# Patient Record
Sex: Male | Born: 1980 | Race: White | Hispanic: No | Marital: Married | Smoking: Never smoker
Health system: Southern US, Community
[De-identification: ages and names within clinical notes are randomized; demographics above are authoritative.]

## PROBLEM LIST (undated history)

## (undated) DIAGNOSIS — R0981 Nasal congestion: Secondary | ICD-10-CM

## (undated) DIAGNOSIS — Z98811 Dental restoration status: Secondary | ICD-10-CM

## (undated) DIAGNOSIS — I10 Essential (primary) hypertension: Secondary | ICD-10-CM

## (undated) DIAGNOSIS — K589 Irritable bowel syndrome without diarrhea: Secondary | ICD-10-CM

## (undated) DIAGNOSIS — G4730 Sleep apnea, unspecified: Secondary | ICD-10-CM

## (undated) DIAGNOSIS — M2550 Pain in unspecified joint: Secondary | ICD-10-CM

## (undated) DIAGNOSIS — R011 Cardiac murmur, unspecified: Secondary | ICD-10-CM

## (undated) DIAGNOSIS — M1990 Unspecified osteoarthritis, unspecified site: Secondary | ICD-10-CM

## (undated) DIAGNOSIS — K610 Anal abscess: Secondary | ICD-10-CM

## (undated) DIAGNOSIS — E559 Vitamin D deficiency, unspecified: Secondary | ICD-10-CM

## (undated) DIAGNOSIS — E7800 Pure hypercholesterolemia, unspecified: Secondary | ICD-10-CM

## (undated) DIAGNOSIS — Z9109 Other allergy status, other than to drugs and biological substances: Secondary | ICD-10-CM

## (undated) HISTORY — PX: CHOLECYSTECTOMY: SHX55

## (undated) HISTORY — DX: Other allergy status, other than to drugs and biological substances: Z91.09

## (undated) HISTORY — PX: KNEE ARTHROSCOPY: SHX127

## (undated) HISTORY — DX: Unspecified osteoarthritis, unspecified site: M19.90

## (undated) HISTORY — DX: Pure hypercholesterolemia, unspecified: E78.00

## (undated) HISTORY — DX: Pain in unspecified joint: M25.50

## (undated) HISTORY — PX: SHOULDER ARTHROSCOPY: SHX128

## (undated) HISTORY — DX: Vitamin D deficiency, unspecified: E55.9

## (undated) HISTORY — PX: ASD REPAIR: SHX258

---
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
## 2007-07-21 ENCOUNTER — Encounter: Admission: RE | Admit: 2007-07-21 | Discharge: 2007-07-21 | Payer: Self-pay | Admitting: Sports Medicine

## 2009-11-21 ENCOUNTER — Ambulatory Visit (HOSPITAL_COMMUNITY): Admission: RE | Admit: 2009-11-21 | Discharge: 2009-11-21 | Payer: Self-pay | Admitting: Family Medicine

## 2009-12-10 ENCOUNTER — Encounter: Admission: RE | Admit: 2009-12-10 | Discharge: 2009-12-10 | Payer: Self-pay | Admitting: Gastroenterology

## 2010-07-21 ENCOUNTER — Ambulatory Visit: Payer: Self-pay

## 2010-07-21 ENCOUNTER — Other Ambulatory Visit: Payer: Self-pay | Admitting: Occupational Medicine

## 2010-07-21 DIAGNOSIS — R52 Pain, unspecified: Secondary | ICD-10-CM

## 2016-01-10 ENCOUNTER — Emergency Department (HOSPITAL_COMMUNITY): Payer: 59

## 2016-01-10 ENCOUNTER — Encounter (HOSPITAL_COMMUNITY): Payer: Self-pay | Admitting: Emergency Medicine

## 2016-01-10 ENCOUNTER — Emergency Department (HOSPITAL_COMMUNITY)
Admission: EM | Admit: 2016-01-10 | Discharge: 2016-01-10 | Disposition: A | Discharge status: Home / Self Care | Payer: 59 | Attending: Emergency Medicine | Admitting: Emergency Medicine

## 2016-01-10 DIAGNOSIS — R1010 Upper abdominal pain, unspecified: Secondary | ICD-10-CM | POA: Diagnosis present

## 2016-01-10 DIAGNOSIS — Z79899 Other long term (current) drug therapy: Secondary | ICD-10-CM | POA: Insufficient documentation

## 2016-01-10 DIAGNOSIS — R7989 Other specified abnormal findings of blood chemistry: Secondary | ICD-10-CM

## 2016-01-10 DIAGNOSIS — R945 Abnormal results of liver function studies: Secondary | ICD-10-CM | POA: Diagnosis not present

## 2016-01-10 DIAGNOSIS — R109 Unspecified abdominal pain: Secondary | ICD-10-CM

## 2016-01-10 DIAGNOSIS — R1013 Epigastric pain: Secondary | ICD-10-CM | POA: Diagnosis not present

## 2016-01-10 LAB — URINALYSIS, ROUTINE W REFLEX MICROSCOPIC
Bacteria, UA: NONE SEEN
Bilirubin Urine: NEGATIVE
GLUCOSE, UA: NEGATIVE mg/dL
Ketones, ur: NEGATIVE mg/dL
Nitrite: NEGATIVE
PH: 5 (ref 5.0–8.0)
PROTEIN: NEGATIVE mg/dL
SPECIFIC GRAVITY, URINE: 1.011 (ref 1.005–1.030)

## 2016-01-10 LAB — CBC
HCT: 43.5 % (ref 39.0–52.0)
Hemoglobin: 15.2 g/dL (ref 13.0–17.0)
MCH: 29 pg (ref 26.0–34.0)
MCHC: 34.9 g/dL (ref 30.0–36.0)
MCV: 83 fL (ref 78.0–100.0)
PLATELETS: 216 10*3/uL (ref 150–400)
RBC: 5.24 MIL/uL (ref 4.22–5.81)
RDW: 12.3 % (ref 11.5–15.5)
WBC: 3.8 10*3/uL — ABNORMAL LOW (ref 4.0–10.5)

## 2016-01-10 LAB — COMPREHENSIVE METABOLIC PANEL
ALK PHOS: 73 U/L (ref 38–126)
ALT: 96 U/L — ABNORMAL HIGH (ref 17–63)
AST: 165 U/L — ABNORMAL HIGH (ref 15–41)
Albumin: 4.6 g/dL (ref 3.5–5.0)
Anion gap: 10 (ref 5–15)
BILIRUBIN TOTAL: 1.1 mg/dL (ref 0.3–1.2)
BUN: 6 mg/dL (ref 6–20)
CALCIUM: 8.9 mg/dL (ref 8.9–10.3)
CO2: 27 mmol/L (ref 22–32)
CREATININE: 0.98 mg/dL (ref 0.61–1.24)
Chloride: 102 mmol/L (ref 101–111)
GFR calc Af Amer: >60 mL/min (ref >60)
GFR calc non Af Amer: >60 mL/min (ref >60)
Glucose, Bld: 98 mg/dL (ref 65–99)
Potassium: 3.6 mmol/L (ref 3.5–5.1)
SODIUM: 139 mmol/L (ref 135–145)
Total Protein: 7.9 g/dL (ref 6.5–8.1)

## 2016-01-10 LAB — LIPASE, BLOOD: Lipase: 30 U/L (ref 11–51)

## 2016-01-10 MED ORDER — MORPHINE SULFATE (PF) 4 MG/ML IV SOLN
4.0000 mg | Freq: Once | INTRAVENOUS | Status: AC
  Administered 2016-01-10: 4 mg via INTRAVENOUS
  Filled 2016-01-10: qty 1

## 2016-01-10 MED ORDER — SODIUM CHLORIDE 0.9 % IV BOLUS (SEPSIS)
1000.0000 mL | Freq: Once | INTRAVENOUS | Status: AC
  Administered 2016-01-10: 1000 mL via INTRAVENOUS

## 2016-01-10 MED ORDER — IOPAMIDOL (ISOVUE-300) INJECTION 61%
100.0000 mL | Freq: Once | INTRAVENOUS | Status: AC | PRN
  Administered 2016-01-10: 100 mL via INTRAVENOUS

## 2016-01-10 MED ORDER — ONDANSETRON HCL 4 MG/2ML IJ SOLN
4.0000 mg | Freq: Once | INTRAMUSCULAR | Status: AC
  Administered 2016-01-10: 4 mg via INTRAVENOUS
  Filled 2016-01-10: qty 2

## 2016-01-10 NOTE — Progress Notes (Signed)
Pt confirms pcp as Ship brokersarah carter spencer PA EPIC updated

## 2016-01-10 NOTE — ED Provider Notes (Signed)
[facility]-EMERGENCY DEPT Provider Note   CSN: [id number] Arrival date & time: 01/10/16  4098     History   Chief Complaint Chief Complaint  Patient presents with  . Back Pain  . Abdominal Pain    HPI Frank Clarke is a 35 y.o. male.  HPI Patient presents to the emergency room for evaluation of abdominal pain.  Patient started having trouble with vomiting and diarrhea on Tuesday. He had multiple episodes. That morning he went to take a shower and had a syncopal episode. Patient did not injure himself. He tried taking some Phenergan and resting. He started to feel somewhat better and was able to go to work on Thursday. However he started having pain again later in the day throughout the night.  The pain is in the upper abdomen and radiates to his back. He does have some chronic thoracic back pain issues and is not sure if this is exacerbating or this is new related to his abdominal pain. He denies any recurrent nausea or vomiting. Because of the pain he went to an urgent care. He was referred to the emergency room for further evaluation. History reviewed. No pertinent past medical history.  There are no active problems to display for this patient.   History reviewed. No pertinent surgical history.     Home Medications    Prior to Admission medications   Medication Sig Start Date End Date Taking? Authorizing Provider  cetirizine (ZYRTEC) 10 MG tablet Take 10 mg by mouth every evening. 01/01/16  Yes Historical Provider, MD  diclofenac (VOLTAREN) 75 MG EC tablet Take 75 mg by mouth 2 (two) times daily. 11/30/15  Yes Historical Provider, MD  hyoscyamine (LEVSIN SL) 0.125 MG SL tablet Place 0.125 mg under the tongue daily as needed for cramping. 09/21/12  Yes Historical Provider, MD    Family History History reviewed. No pertinent family history.  Social History Social History  Substance Use Topics  . Smoking status: Never Smoker  . Smokeless tobacco: Never Used  . Alcohol use  Yes     Comment: 2 drinks a week     Allergies   Patient has no known allergies.   Review of Systems Review of Systems   Physical Exam Updated Vital Signs BP 111/76 (BP Location: Left Arm)   Pulse 70   Temp 98.1 F (36.7 C) (Oral)   Resp 14   SpO2 97%   Physical Exam  Constitutional: He appears well-developed and well-nourished. No distress.  HENT:  Head: Normocephalic and atraumatic.  Right Ear: External ear normal.  Left Ear: External ear normal.  Eyes: Conjunctivae are normal. Right eye exhibits no discharge. Left eye exhibits no discharge. No scleral icterus.  Neck: Neck supple. No tracheal deviation present.  Cardiovascular: Normal rate, regular rhythm and intact distal pulses.   Pulmonary/Chest: Effort normal and breath sounds normal. No stridor. No respiratory distress. He has no wheezes. He has no rales.  Abdominal: Soft. Bowel sounds are normal. He exhibits no distension. There is tenderness. There is no rebound and no guarding.  Mild epigastric region  Musculoskeletal: He exhibits no edema or tenderness.  Neurological: He is alert. He has normal strength. No cranial nerve deficit (no facial droop, extraocular movements intact, no slurred speech) or sensory deficit. He exhibits normal muscle tone. He displays no seizure activity. Coordination normal.  Skin: Skin is warm and dry. No rash noted.  Psychiatric: He has a normal mood and affect.  Nursing note and vitals reviewed.  ED Treatments / Results  Labs (all labs ordered are listed, but only abnormal results are displayed) Labs Reviewed  COMPREHENSIVE METABOLIC PANEL - Abnormal; Notable for the following:       Result Value   AST 165 (*)    ALT 96 (*)    All other components within normal limits  CBC - Abnormal; Notable for the following:    WBC 3.8 (*)    All other components within normal limits  URINALYSIS, ROUTINE W REFLEX MICROSCOPIC - Abnormal; Notable for the following:    Hgb urine dipstick  SMALL (*)    Leukocytes, UA TRACE (*)    Squamous Epithelial / LPF 0-5 (*)    All other components within normal limits  LIPASE, BLOOD    EKG  EKG Interpretation  Date/Time:  Friday January 10 2016 09:54:12 EST Ventricular Rate:  88 PR Interval:    QRS Duration: 99 QT Interval:  373 QTC Calculation: 452 R Axis:   78 Text Interpretation:  Sinus rhythm Low voltage, precordial leads RSR' in V1 or V2, right VCD or RVH Baseline wander in lead(s) V1 No old tracing to compare Confirmed by Natha Guin  MD-J, Leonia Heatherly ([id number]) on 01/10/2016 9:59:44 AM Also confirmed by Demesha Boorman  MD-J, Mozes Sagar [phone]), editor [place]  CCT, BEVERLY ([id number])  on 01/10/2016 10:45:38 AM       Radiology Dg Chest 2 View  Result Date: 01/10/2016 CLINICAL DATA:  Mid upper abdominal pain, nausea and vomiting. EXAM: CHEST  2 VIEW COMPARISON:  None. FINDINGS: Trachea is midline. Heart size normal. Lungs are clear. No pleural fluid. Visualized portion of the upper abdomen is unremarkable. IMPRESSION: No acute findings. Electronically Signed   By: Leanna BattlesMelinda  Blietz M.D.   On: 01/10/2016 11:04   Ct Abdomen Pelvis W Contrast  Result Date: 01/10/2016 CLINICAL DATA:  Abdominal pain EXAM: CT ABDOMEN AND PELVIS WITH CONTRAST TECHNIQUE: Multidetector CT imaging of the abdomen and pelvis was performed using the standard protocol following bolus administration of intravenous contrast. CONTRAST:  100mL ISOVUE-300 IOPAMIDOL (ISOVUE-300) INJECTION 61% COMPARISON:  12/10/2009 FINDINGS: Lower chest: No acute abnormality. Hepatobiliary: Postcholecystectomy.  Liver is unremarkable. Pancreas: Unremarkable. Spleen: Unremarkable. Adrenals/Urinary Tract: Adrenal glands and kidneys are unremarkable. Unremarkable bladder. Stomach/Bowel: No evidence of small-bowel obstruction. No obvious focal mass of the colon. Normal appendix. Vascular/Lymphatic: No aortic aneurysm. There are several subcentimeter short axis diameter para-aortic lymph nodes within the abdomen.  There is also significant stranding within the fat surrounding the small lymph nodes. These findings are not significantly changed compare with the prior MRI, and support benign etiology. Reproductive: Prostate is unremarkable. Other: No free-fluid. 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 Posterior disc bulge at L3-4. No vertebral compression deformity. IMPRESSION: No acute intra-abdominal pathology.  Chronic changes are noted. Electronically Signed   By: Jolaine ClickArthur  Hoss M.D.   On: 01/10/2016 12:06    Procedures Procedures (including critical care time)  Medications Ordered in ED Medications  morphine 4 MG/ML injection 4 mg (4 mg Intravenous Given 01/10/16 1034)  ondansetron (ZOFRAN) injection 4 mg (4 mg Intravenous Given 01/10/16 1033)  sodium chloride 0.9 % bolus 1,000 mL (1,000 mLs Intravenous New Bag/Given 01/10/16 1033)  iopamidol (ISOVUE-300) 61 % injection 100 mL (100 mLs Intravenous Contrast Given 01/10/16 1140)     Initial Impression / Assessment and Plan / ED Course  I have reviewed the triage vital signs and the nursing notes.  Pertinent labs & imaging results that were available during my care of the patient were  reviewed by me and considered in my medical decision making (see chart for details).  Clinical Course as of Jan 10 1304  Fri Jan 10, 2016  1142 LFTs increased with normal bili.  Trace LE and 0-5 WBC in urine .  Otherwise normal labs.   [JK]    Clinical Course User Index [JK] Linwood DibblesJon Jayliah Benett, MD    Lab tests and CT scan are reassuring.  LFTs are increased.  Etiology unclear.  No structural issue noted on CT scan.  ?viral etiology with the previous vomiting and diarrhea.  Fortunately, no acute abnormality today requiring surgery or hospitalization.  Discussed plans to follow up on the LFTs  Final Clinical Impressions(s) / ED Diagnoses   Final diagnoses:  Abdominal pain, unspecified abdominal location  Elevated LFTs    New Prescriptions New  Prescriptions   No medications on file     Linwood DibblesJon Nohealani Medinger, MD 01/10/16 1305

## 2016-01-10 NOTE — Discharge Instructions (Signed)
Follow-up with your primary care doctor as we discussed to recheck your liver function tests.  Return as needed for fever, vomiting, worsening symptoms

## 2016-01-10 NOTE — ED Triage Notes (Addendum)
Pt reports upper abd pain and back pain since yesterday. Denies CP. Pt reports it felt like his reflux yesterday, but no reflux today and pain continues.  Pt finishing recovering from stomach but that had an onset on Tuesday. Able to keep fluids and crackers down since yesterday. Did have a syncopal episode and fall a few days ago in midst of stomach bug and accompanied by fever.  No lightheadedness, dizziness, or SOB.

## 2016-10-16 ENCOUNTER — Other Ambulatory Visit: Payer: Self-pay | Admitting: Surgery

## 2016-10-19 DIAGNOSIS — K610 Anal abscess: Secondary | ICD-10-CM

## 2016-10-19 HISTORY — DX: Anal abscess: K61.0

## 2016-10-29 ENCOUNTER — Encounter (HOSPITAL_BASED_OUTPATIENT_CLINIC_OR_DEPARTMENT_OTHER): Payer: Self-pay | Admitting: *Deleted

## 2016-10-29 DIAGNOSIS — R0981 Nasal congestion: Secondary | ICD-10-CM

## 2016-10-29 HISTORY — DX: Nasal congestion: R09.81

## 2016-11-03 NOTE — H&P (Signed)
Frank Clarke  Location: [facility] Patient #: [id number] DOB: 10/05/1980 Married / Language: English / Race: White Male   History of Present Illness  Patient words: New- hidradenitis.  The patient is a 36 year old male who presents with a complaint of Hidradenitis. Frank Clarke is well-known to me. He has had hidradenitis since he was in his teens. He is recently had an area on his perianal area that has continued to drain.   Past Surgical History  Gallbladder Surgery - Laparoscopic  Knee Surgery  Bilateral. Shoulder Surgery  Right.  Diagnostic Studie  Colonoscopy  1-5 years ago  Allergies  No Known Drug Allergies 09/15/2016  Medication History  Diclofenac Sodium (  Tablet DR, Oral) Active. ZyrTEC (  Tablet, Oral) Active. Medications Reconciled  Social History  Alcohol use  Occasional alcohol use. Caffeine use  Carbonated beverages, Coffee, Tea. No drug use  Tobacco use  Never smoker.  Family History  Arthritis  Father. Colon Polyps  Mother. Melanoma  Mother. Migraine Headache  Brother, Father.  Other Problems  Back Pain  Heart murmur     Review of Systems  General Not Present- Appetite Loss, Chills, Fatigue, Fever, Night Sweats, Weight Gain and Weight Loss. Skin Present- Ulcer. Not Present- Change in Wart/Mole, Dryness, Hives, Jaundice, New Lesions, Non-Healing Wounds and Rash. HEENT Present- Seasonal Allergies. Not Present- Earache, Hearing Loss, Hoarseness, Nose Bleed, Oral Ulcers, Ringing in the Ears, Sinus Pain, Sore Throat, Visual Disturbances, Wears glasses/contact lenses and Yellow Eyes. Respiratory Not Present- Bloody sputum, Chronic Cough, Difficulty Breathing, Snoring and Wheezing. Breast Not Present- Breast Mass, Breast Pain, Nipple Discharge and Skin Changes. Cardiovascular Not Present- Chest Pain, Difficulty Breathing Lying Down, Leg Cramps, Palpitations, Rapid Heart Rate, Shortness of Breath and Swelling of  Extremities. Gastrointestinal Not Present- Abdominal Pain, Bloating, Bloody Stool, Change in Bowel Habits, Chronic diarrhea, Constipation, Difficulty Swallowing, Excessive gas, Gets full quickly at meals, Hemorrhoids, Indigestion, Nausea, Rectal Pain and Vomiting. Male Genitourinary Not Present- Blood in Urine, Change in Urinary Stream, Frequency, Impotence, Nocturia, Painful Urination, Urgency and Urine Leakage. Musculoskeletal Present- Back Pain. Not Present- Joint Pain, Joint Stiffness, Muscle Pain, Muscle Weakness and Swelling of Extremities. Neurological Not Present- Decreased Memory, Fainting, Headaches, Numbness, Seizures, Tingling, Tremor, Trouble walking and Weakness. Psychiatric Not Present- Anxiety, Bipolar, Change in Sleep Pattern, Depression, Fearful and Frequent crying. Endocrine Not Present- Cold Intolerance, Excessive Hunger, Hair Changes, Heat Intolerance, Hot flashes and New Diabetes. Hematology Not Present- Blood Thinners, Easy Bruising, Excessive bleeding, Gland problems, HIV and Persistent Infections.  Vitals   Weight: 275 lb Height: 71.5in Body Surface Area: 2.43 m Body Mass Index: 37.82 kg/m  Temp.: 98.58F(Oral)  Pulse: 90 (Regular)  BP: 126/74 (Sitting, Left Arm, Standard)    Physical Exam  The physical exam findings are as follows: Note:He has chronic skin changes of both his axilla as well as across his lower abdomen consistent with hidradenitis. There is a small area at the external perianal skin near the buttocks which is consistent with an abscess draining some fluid.  After obtaining consent, I prepped with Betadine, anesthetized with lidocaine, and then ellipsed out a piece of skin with a punch biopsy. I then entered a deeper abscess cavity. I made the incision slightly larger and impact the cavity.    Assessment & Plan   HIDRADENITIS (L73.2)  Impression: This may represent just a de novo. No abscess and hidradenitis. I will put him on  clindamycin for this area and he will try the clindamycin gel on  his other areas of hidradenitis. I will see him back as needed Current Plans Started Norco 5-325MG , 1 (one) Tablet every four hours, as needed, #20, 09/15/2016, No Refill. Started Clindamycin HCl , 1 (one) Capsule three times daily, #21, 09/15/2016, No Refill. Started Clindamycin Phosphate 1%, 1 (one) Application two times daily, 1 Tube, 09/15/2016, Ref. x3.  Addendum:  The place in his perianal area has persisted.  This may mean there is a potential perianal fistula.  We will proceed to the OR for an Exam under anesthesia and possible fistulotomy vs wider abscess drainage.  We discussed the risks in detail.

## 2016-11-04 ENCOUNTER — Encounter (HOSPITAL_BASED_OUTPATIENT_CLINIC_OR_DEPARTMENT_OTHER): Payer: Self-pay | Admitting: *Deleted

## 2016-11-04 ENCOUNTER — Ambulatory Visit (HOSPITAL_BASED_OUTPATIENT_CLINIC_OR_DEPARTMENT_OTHER)
Admission: RE | Admit: 2016-11-04 | Discharge: 2016-11-04 | Disposition: A | Discharge status: Home / Self Care | Payer: 59 | Source: Ambulatory Visit | Attending: Surgery | Admitting: Surgery

## 2016-11-04 ENCOUNTER — Ambulatory Visit (HOSPITAL_BASED_OUTPATIENT_CLINIC_OR_DEPARTMENT_OTHER): Payer: 59 | Admitting: Certified Registered"

## 2016-11-04 ENCOUNTER — Encounter (HOSPITAL_BASED_OUTPATIENT_CLINIC_OR_DEPARTMENT_OTHER)
Admission: RE | Disposition: A | Discharge status: Home / Self Care | Payer: Self-pay | Source: Ambulatory Visit | Attending: Surgery

## 2016-11-04 DIAGNOSIS — K610 Anal abscess: Secondary | ICD-10-CM | POA: Diagnosis present

## 2016-11-04 HISTORY — PX: EVALUATION UNDER ANESTHESIA WITH FISTULECTOMY: SHX5623

## 2016-11-04 HISTORY — DX: Dental restoration status: Z98.811

## 2016-11-04 HISTORY — DX: Irritable bowel syndrome, unspecified: K58.9

## 2016-11-04 HISTORY — DX: Anal abscess: K61.0

## 2016-11-04 HISTORY — DX: Nasal congestion: R09.81

## 2016-11-04 SURGERY — EXAM UNDER ANESTHESIA WITH FISTULECTOMY
Anesthesia: General | Site: Buttocks | Laterality: Left

## 2016-11-04 MED ORDER — LACTATED RINGERS IV SOLN
INTRAVENOUS | Status: DC
  Administered 2016-11-04 (×2): via INTRAVENOUS

## 2016-11-04 MED ORDER — BUPIVACAINE LIPOSOME 1.3 % IJ SUSP
INTRAMUSCULAR | Status: AC
  Filled 2016-11-04: qty 20

## 2016-11-04 MED ORDER — BUPIVACAINE-EPINEPHRINE 0.5% -1:200000 IJ SOLN
INTRAMUSCULAR | Status: DC | PRN
Start: 2016-11-04 — End: 2016-11-04
  Administered 2016-11-04: 10 mL

## 2016-11-04 MED ORDER — FENTANYL CITRATE (PF) 100 MCG/2ML IJ SOLN
INTRAMUSCULAR | Status: AC
  Filled 2016-11-04: qty 2

## 2016-11-04 MED ORDER — FENTANYL CITRATE (PF) 100 MCG/2ML IJ SOLN
50.0000 ug | INTRAMUSCULAR | Status: DC | PRN
  Administered 2016-11-04: 100 ug via INTRAVENOUS

## 2016-11-04 MED ORDER — BUPIVACAINE-EPINEPHRINE (PF) 0.5% -1:200000 IJ SOLN
INTRAMUSCULAR | Status: AC
  Filled 2016-11-04: qty 90

## 2016-11-04 MED ORDER — DEXAMETHASONE SODIUM PHOSPHATE 4 MG/ML IJ SOLN
INTRAMUSCULAR | Status: DC | PRN
  Administered 2016-11-04: 10 mg via INTRAVENOUS

## 2016-11-04 MED ORDER — OXYCODONE HCL 5 MG PO TABS: 5.0000 mg | ORAL_TABLET | Freq: Four times a day (QID) | ORAL | 0 refills | Status: DC | PRN

## 2016-11-04 MED ORDER — PROPOFOL 10 MG/ML IV BOLUS
INTRAVENOUS | Status: DC | PRN
  Administered 2016-11-04: 200 mg via INTRAVENOUS

## 2016-11-04 MED ORDER — LIDOCAINE 5 % EX OINT: 1.0000 "application " | TOPICAL_OINTMENT | Freq: Four times a day (QID) | CUTANEOUS | 0 refills | Status: DC | PRN

## 2016-11-04 MED ORDER — KETOROLAC TROMETHAMINE 30 MG/ML IJ SOLN
INTRAMUSCULAR | Status: DC | PRN
  Administered 2016-11-04: 30 mg via INTRAVENOUS

## 2016-11-04 MED ORDER — METHYLENE BLUE 0.5 % INJ SOLN
INTRAVENOUS | Status: AC
  Filled 2016-11-04: qty 20

## 2016-11-04 MED ORDER — BUPIVACAINE LIPOSOME 1.3 % IJ SUSP
INTRAMUSCULAR | Status: DC | PRN
  Administered 2016-11-04: 20 mL

## 2016-11-04 MED ORDER — MIDAZOLAM HCL 2 MG/2ML IJ SOLN
1.0000 mg | INTRAMUSCULAR | Status: DC | PRN
  Administered 2016-11-04: 2 mg via INTRAVENOUS

## 2016-11-04 MED ORDER — SCOPOLAMINE 1 MG/3DAYS TD PT72: 1.0000 | MEDICATED_PATCH | Freq: Once | TRANSDERMAL | Status: DC | PRN

## 2016-11-04 MED ORDER — MIDAZOLAM HCL 2 MG/2ML IJ SOLN
INTRAMUSCULAR | Status: AC
  Filled 2016-11-04: qty 2

## 2016-11-04 MED ORDER — LIDOCAINE HCL (CARDIAC) 20 MG/ML IV SOLN
INTRAVENOUS | Status: DC | PRN
  Administered 2016-11-04: 60 mg via INTRAVENOUS

## 2016-11-04 MED ORDER — CHLORHEXIDINE GLUCONATE CLOTH 2 % EX PADS: 6.0000 | MEDICATED_PAD | Freq: Once | CUTANEOUS | Status: DC

## 2016-11-04 MED ORDER — CEFAZOLIN SODIUM-DEXTROSE 1-4 GM/50ML-% IV SOLN
INTRAVENOUS | Status: AC
  Filled 2016-11-04: qty 50

## 2016-11-04 MED ORDER — CEFAZOLIN SODIUM-DEXTROSE 2-4 GM/100ML-% IV SOLN
INTRAVENOUS | Status: AC
  Filled 2016-11-04: qty 100

## 2016-11-04 MED ORDER — ACETAMINOPHEN 500 MG PO TABS: 1000.0000 mg | ORAL_TABLET | Freq: Once | ORAL | Status: DC

## 2016-11-04 MED ORDER — SODIUM CHLORIDE 0.9 % IJ SOLN
INTRAMUSCULAR | Status: AC
  Filled 2016-11-04: qty 20

## 2016-11-04 MED ORDER — ONDANSETRON HCL 4 MG/2ML IJ SOLN
INTRAMUSCULAR | Status: DC | PRN
  Administered 2016-11-04: 4 mg via INTRAVENOUS

## 2016-11-04 MED ORDER — CEFAZOLIN SODIUM-DEXTROSE 2-4 GM/100ML-% IV SOLN
2.0000 g | INTRAVENOUS | Status: AC
  Administered 2016-11-04: 3 g via INTRAVENOUS

## 2016-11-04 SURGICAL SUPPLY — 31 items
BLADE SURG 15 STRL LF DISP TIS (BLADE) ×1 IMPLANT
BLADE SURG 15 STRL SS (BLADE) ×2
BRIEF STRETCH FOR OB PAD LRG (UNDERPADS AND DIAPERS) ×3 IMPLANT
CANISTER SUCT 1200ML W/VALVE (MISCELLANEOUS) ×3 IMPLANT
COVER MAYO STAND STRL (DRAPES) IMPLANT
DRAPE UTILITY XL STRL (DRAPES) ×3 IMPLANT
DRSG PAD ABDOMINAL 8X10 ST (GAUZE/BANDAGES/DRESSINGS) ×3 IMPLANT
ELECT REM PT RETURN 9FT ADLT (ELECTROSURGICAL) ×3
ELECTRODE REM PT RTRN 9FT ADLT (ELECTROSURGICAL) ×1 IMPLANT
GAUZE SPONGE 4X4 12PLY STRL (GAUZE/BANDAGES/DRESSINGS) ×3 IMPLANT
GAUZE SPONGE 4X4 12PLY STRL LF (GAUZE/BANDAGES/DRESSINGS) ×3 IMPLANT
GLOVE SURG SIGNA 7.5 PF LTX (GLOVE) ×3 IMPLANT
GOWN STRL REUS W/ TWL LRG LVL3 (GOWN DISPOSABLE) ×1 IMPLANT
GOWN STRL REUS W/ TWL XL LVL3 (GOWN DISPOSABLE) ×1 IMPLANT
GOWN STRL REUS W/TWL LRG LVL3 (GOWN DISPOSABLE) ×2
GOWN STRL REUS W/TWL XL LVL3 (GOWN DISPOSABLE) ×2
NEEDLE HYPO 25X1 1.5 SAFETY (NEEDLE) ×3 IMPLANT
PACK BASIN DAY SURGERY FS (CUSTOM PROCEDURE TRAY) ×3 IMPLANT
PACK LITHOTOMY IV (CUSTOM PROCEDURE TRAY) ×3 IMPLANT
PENCIL BUTTON HOLSTER BLD 10FT (ELECTRODE) ×3 IMPLANT
SPONGE SURGIFOAM ABS GEL 100 (HEMOSTASIS) ×3 IMPLANT
SURGILUBE 2OZ TUBE FLIPTOP (MISCELLANEOUS) ×3 IMPLANT
SUT CHROMIC 3 0 SH 27 (SUTURE) IMPLANT
SYR CONTROL 10ML LL (SYRINGE) ×3 IMPLANT
TOWEL OR 17X24 6PK STRL BLUE (TOWEL DISPOSABLE) ×3 IMPLANT
TOWEL OR NON WOVEN STRL DISP B (DISPOSABLE) ×3 IMPLANT
TRAY DSU PREP LF (CUSTOM PROCEDURE TRAY) ×3 IMPLANT
TUBE CONNECTING 20'X1/4 (TUBING) ×1
TUBE CONNECTING 20X1/4 (TUBING) ×2 IMPLANT
UNDERPAD 30X30 (UNDERPADS AND DIAPERS) ×3 IMPLANT
YANKAUER SUCT BULB TIP NO VENT (SUCTIONS) ×3 IMPLANT

## 2016-11-04 NOTE — Transfer of Care (Signed)
Immediate Anesthesia Transfer of Care Note  Patient: Frank GoodellMatthew W Kops  Procedure(s) Performed: Francia GreavesEXAM UNDER ANESTHESIA WITH POSSIBLE  FISTULOTOMY AND POSSIBLE ABSCESS DRAINAGE (N/A )  Patient Location: PACU  Anesthesia Type:General  Level of Consciousness: awake and patient cooperative  Airway & Oxygen Therapy: Patient Spontanous Breathing and Patient connected to face mask oxygen  Post-op Assessment: Report given to RN and Post -op Vital signs reviewed and stable  Post vital signs: Reviewed and stable  Last Vitals:  Vitals:   11/04/16 0829  BP: 125/78  Pulse: 71  Resp: 18  Temp: 36.8 C  SpO2: 98%    Last Pain:  Vitals:   11/04/16 0829  TempSrc: Oral  PainSc: 1       Patients Stated Pain Goal: 1 (11/04/16 0829)  Complications: No apparent anesthesia complications

## 2016-11-04 NOTE — Anesthesia Postprocedure Evaluation (Signed)
Anesthesia Post Note  Patient: Frank Clarke  Procedure(s) Performed: Francia GreavesEXAM UNDER ANESTHESIA WITH FISTULOTOMY (Left Buttocks)     Patient location during evaluation: PACU Anesthesia Type: General Level of consciousness: awake and alert Pain management: pain level controlled Vital Signs Assessment: post-procedure vital signs reviewed and stable Respiratory status: spontaneous breathing, nonlabored ventilation and respiratory function stable Cardiovascular status: blood pressure returned to baseline and stable Postop Assessment: no apparent nausea or vomiting Anesthetic complications: no    Last Vitals:  Vitals:   11/04/16 1000 11/04/16 1030  BP: 126/76 125/80  Pulse: 85 70  Resp: 19 18  Temp:  36.7 C  SpO2: 98% 99%    Last Pain:  Vitals:   11/04/16 1030  TempSrc:   PainSc: 0-No pain                 Cecile HearingStephen Edward Turk

## 2016-11-04 NOTE — Interval H&P Note (Signed)
History and Physical Interval Note: no change in H and P  11/04/2016 8:11 AM  Frank Clarke  has presented today for surgery, with the diagnosis of chronic perianal abscess  The various methods of treatment have been discussed with the patient and family. After consideration of risks, benefits and other options for treatment, the patient has consented to  Procedure(s): EXAM UNDER ANESTHESIA WITH POSSIBLE  FISTULOTOMY AND POSSIBLE ABSCESS DRAINAGE (N/A) as a surgical intervention .  The patient's history has been reviewed, patient examined, no change in status, stable for surgery.  I have reviewed the patient's chart and labs.  Questions were answered to the patient's satisfaction.     [name] A

## 2016-11-04 NOTE — Anesthesia Preprocedure Evaluation (Addendum)
Anesthesia Evaluation  Patient identified by MRN, date of birth, ID band Patient awake    Reviewed: Allergy & Precautions, NPO status , Patient's Chart, lab work & pertinent test results  Airway Mallampati: II  TM Distance: >3 FB Neck ROM: Full    Dental  (+) Teeth Intact, Dental Advisory Given, Caps   Pulmonary neg pulmonary ROS,    Pulmonary exam normal breath sounds clear to auscultation       Cardiovascular Exercise Tolerance: Good negative cardio ROS Normal cardiovascular exam Rhythm:Regular Rate:Normal     Neuro/Psych negative neurological ROS     GI/Hepatic negative GI ROS, Neg liver ROS,   Endo/Other  Obesity   Renal/GU negative Renal ROS     Musculoskeletal negative musculoskeletal ROS (+)   Abdominal   Peds  Hematology negative hematology ROS (+)   Anesthesia Other Findings Day of surgery medications reviewed with the patient.  Reproductive/Obstetrics                            Anesthesia Physical Anesthesia Plan  ASA: II  Anesthesia Plan: General   Post-op Pain Management:    Induction: Intravenous  PONV Risk Score and Plan: 2 and Ondansetron and Dexamethasone  Airway Management Planned: LMA  Additional Equipment:   Intra-op Plan:   Post-operative Plan: Extubation in OR  Informed Consent: I have reviewed the patients History and Physical, chart, labs and discussed the procedure including the risks, benefits and alternatives for the proposed anesthesia with the patient or authorized representative who has indicated his/her understanding and acceptance.   Dental advisory given  Plan Discussed with: CRNA  Anesthesia Plan Comments: (Risks/benefits of general anesthesia discussed with patient including risk of damage to teeth, lips, gum, and tongue, nausea/vomiting, allergic reactions to medications, and the possibility of heart attack, stroke and death.  All  patient questions answered.  Patient wishes to proceed.)        Anesthesia Quick Evaluation

## 2016-11-04 NOTE — Anesthesia Procedure Notes (Signed)
Procedure Name: LMA Insertion Date/Time: 11/04/2016 9:08 AM Performed by: Lizania Bouchard D Pre-anesthesia Checklist: Patient identified, Emergency Drugs available, Suction available and Patient being monitored Patient Re-evaluated:Patient Re-evaluated prior to induction Oxygen Delivery Method: Circle system utilized Preoxygenation: Pre-oxygenation with 100% oxygen Induction Type: IV induction Ventilation: Mask ventilation without difficulty LMA: LMA inserted LMA Size: 4.0 Number of attempts: 1 Airway Equipment and Method: Bite block Placement Confirmation: positive ETCO2 Tube secured with: Tape Dental Injury: Teeth and Oropharynx as per pre-operative assessment

## 2016-11-04 NOTE — Op Note (Signed)
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 No other intracranial pathology is identified. I saw no evidence of inflammatory bowel disease, malignancy, or hidradenitis  Procedure: The patient was brought to the operative room and identified as the correct patient. He was placed supine on the operating table and general anesthesia was induced. He was placed in the lithotomy position. His perianal area was then prepped and draped in the usual sterile fashion. I inserted a retractor into the anal canal and inspected it circumferentially. He had 2 small openings on the left lateral anal skin. These 2 areas connected after I placed a probe through them. I opened up the skin in between. I then identified an area with a probe going into the anal canal directly. This appeared to be above the sphincter muscles. I think completed the sphincterotomy cauterizing all the tissue over the top of the fistula probe. I do not see evidence of malignancy, inflammatory bowel disease, or hidradenitis. Hemostasis appeared to be achieved with the cautery. I anesthetized the wound with Exparel and Marcaine.  I then placed a piece of Gelfoam into the anal canal. The patient tolerated the procedure well. All the counts were correct at the end of the procedure. He was then placed back into the prone position, extubated in the operating room, and taken in a stable condition to the recovery room          [name]   Date: 11/04/2016  Time: 9:30 AM

## 2016-11-04 NOTE — Discharge Instructions (Signed)
[facility] _______[facility] ° °RECTAL SURGERY POST OP INSTRUCTIONS: POST OP INSTRUCTIONS ° °Always review your discharge instruction sheet given to you by the facility where your surgery was performed. °IF YOU HAVE DISABILITY OR FAMILY LEAVE FORMS, YOU MUST BRING THEM TO THE OFFICE FOR PROCESSING.   °DO NOT GIVE THEM TO YOUR DOCTOR. ° °1. A  prescription for pain medication may be given to you upon discharge.  Take your pain medication as prescribed, if needed.  If narcotic pain medicine is not needed, then you may take acetaminophen (Tylenol) or ibuprofen (Advil) as needed. °2. Take your usually prescribed medications unless otherwise directed. °3. If you need a refill on your pain medication, please contact your pharmacy.  They will contact our office to request authorization. Prescriptions will not be filled after 5 pm or on week-ends. °4. You should follow a light diet the first 48 hours after arrival home, such as soup and crackers, etc.  Be sure to include lots of fluids daily.  Resume your normal diet 2-3 days after surgery.. °5. Most patients will experience some swelling and discomfort in the rectal area. Ice packs, reclining and warm tub soaks will help.  Swelling and discomfort can take several days to resolve.  °6. It is common to experience some constipation if taking pain medication after surgery.  Increasing fluid intake and taking a stool softener (such as Colace) will usually help or prevent this problem from occurring.  A mild laxative (Milk of Magnesia or Miralax) should be taken according to package directions if there are no bowel movements after 48 hours. °7. Unless discharge instructions indicate otherwise, leave your bandage dry and in place for 24 hours, or remove the bandage if you have a bowel movement. You may notice a small amount of bleeding with bowel movements for the first few days. You may have some packing in the rectum which will come out over the first day or two. You  will need to wear an absorbent pad or soft cotton gauze in your underwear until the drainage stops.it. °8. ACTIVITIES:  You may resume regular (light) daily activities beginning the next day--such as daily self-care, walking, climbing stairs--gradually increasing activities as tolerated.  You may have sexual intercourse when it is comfortable.  Refrain from any heavy lifting or straining until approved by your doctor. °a. You may drive when you are no longer taking prescription pain medication, you can comfortably wear a seatbelt, and you can safely maneuver your car and apply brakes. °b. RETURN TO WORK: : ____________________ °c.  °9. You should see your doctor in the office for a follow-up appointment approximately 2-3 weeks after your surgery.  Make sure that you call for this appointment within a day or two after you arrive home to insure a convenient appointment time. °10. OTHER INSTRUCTIONS:  __________________________________________________________________________________________________________________________________________________________________________________________  °WHEN TO CALL YOUR DOCTOR: °1. Fever over 101.0 °2. Inability to urinate °3. Nausea and/or vomiting °4. Extreme swelling or bruising °5. Continued bleeding from rectum. °6. Increased pain, redness, or drainage from the incision °7. Constipation ° °The clinic staff is available to answer your questions during regular business hours.  Please don’t hesitate to call and ask to speak to one of the nurses for clinical concerns.  If you have a medical emergency, go to the nearest emergency room or call 911.  A surgeon from [facility] is always on call at the hospital ° ° °[address], [address], [place], [place]  [address] ? °   [address] [address], [place], [place]   [address] °[phone] ? [phone] ? FAX [phone] °Web site: [email or web address] ° ° °Post Anesthesia Home Care Instructions ° °Activity: °Get  plenty of rest for the remainder of the day. A responsible individual must stay with you for 24 hours following the procedure.  °For the next 24 hours, DO NOT: °-Drive a car °-Operate machinery °-Drink alcoholic beverages °-Take any medication unless instructed by your physician °-Make any legal decisions or sign important papers. ° °Meals: °Start with liquid foods such as gelatin or soup. Progress to regular foods as tolerated. Avoid greasy, spicy, heavy foods. If nausea and/or vomiting occur, drink only clear liquids until the nausea and/or vomiting subsides. Call your physician if vomiting continues. ° °Special Instructions/Symptoms: °Your throat may feel dry or sore from the anesthesia or the breathing tube placed in your throat during surgery. If this causes discomfort, gargle with warm salt water. The discomfort should disappear within 24 hours. ° °If you had a scopolamine patch placed behind your ear for the management of post- operative nausea and/or vomiting: ° °1. The medication in the patch is effective for 72 hours, after which it should be removed.  Wrap patch in a tissue and discard in the trash. Wash hands thoroughly with soap and water. °2. You may remove the patch earlier than 72 hours if you experience unpleasant side effects which may include dry mouth, dizziness or visual disturbances. °3. Avoid touching the patch. Wash your hands with soap and water after contact with the patch. °  ° °

## 2016-11-05 ENCOUNTER — Encounter (HOSPITAL_BASED_OUTPATIENT_CLINIC_OR_DEPARTMENT_OTHER): Payer: Self-pay | Admitting: Surgery

## 2016-12-17 ENCOUNTER — Other Ambulatory Visit: Payer: Self-pay | Admitting: Surgery

## 2017-10-18 IMAGING — CT CT ABD-PELV W/ CM
2 of 4 series · 17 of 46 positions shown, 19 images · IV contrast (ISOVUE)
Comparison: 12/10/2009

CLINICAL DATA: Abdominal pain

EXAM:
CT ABDOMEN AND PELVIS WITH CONTRAST
TECHNIQUE: Multidetector CT imaging of the abdomen and pelvis was performed
using the standard protocol following bolus administration of
intravenous contrast.
CONTRAST:  100mL T4SQAO-1BB IOPAMIDOL (T4SQAO-1BB) INJECTION 61%

[Series 2: abd/pel with · axial · 0.92mm/px · z∈[+1263,+1683]mm · 14 of 96 slices shown, 16 images]
[im 6/96  soft-tissue]
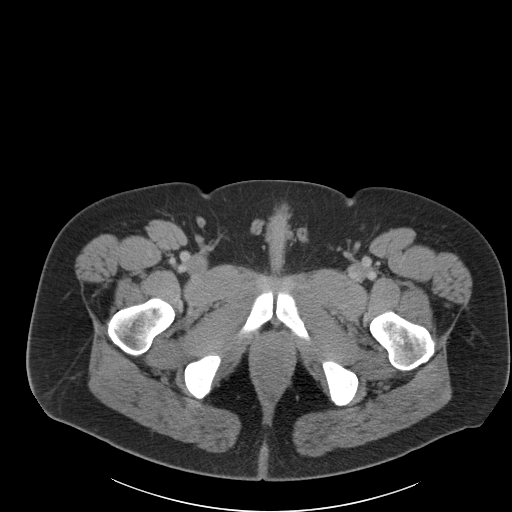
[im 6/96  bone]
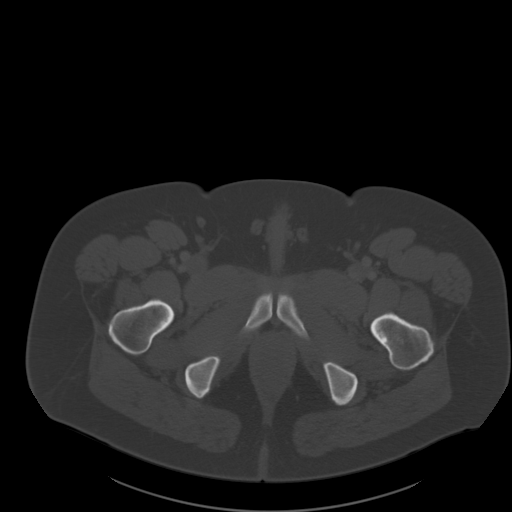
[im 12/96  soft-tissue]
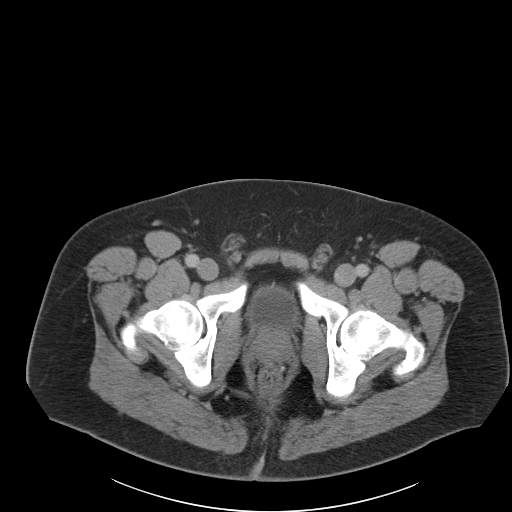
[im 17/96  soft-tissue]
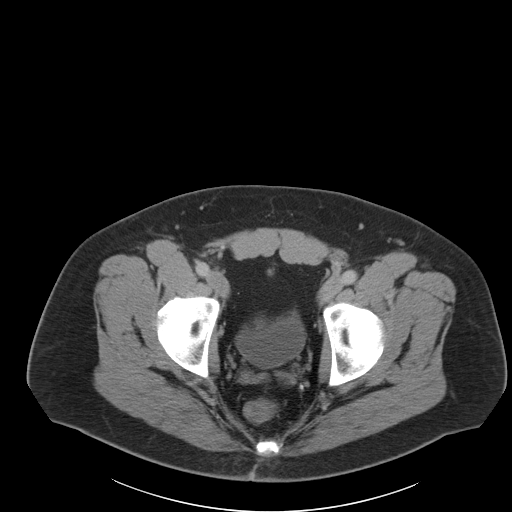
[im 28/96  soft-tissue]
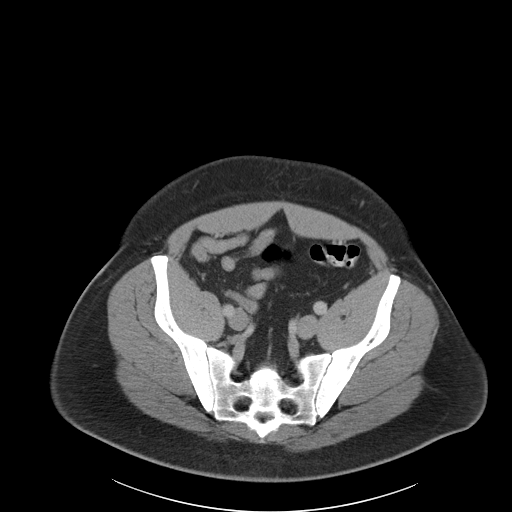
[im 34/96  soft-tissue]
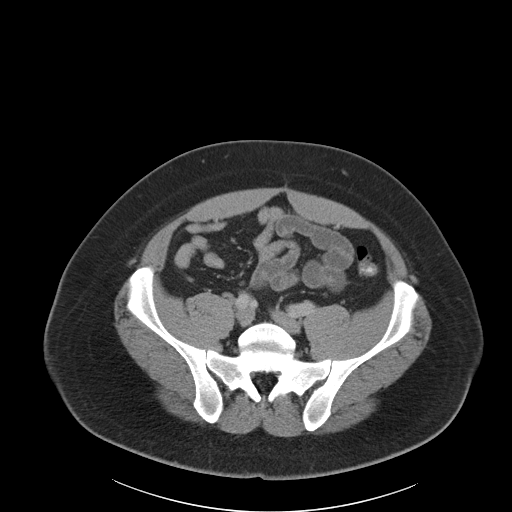
[im 40/96  soft-tissue]
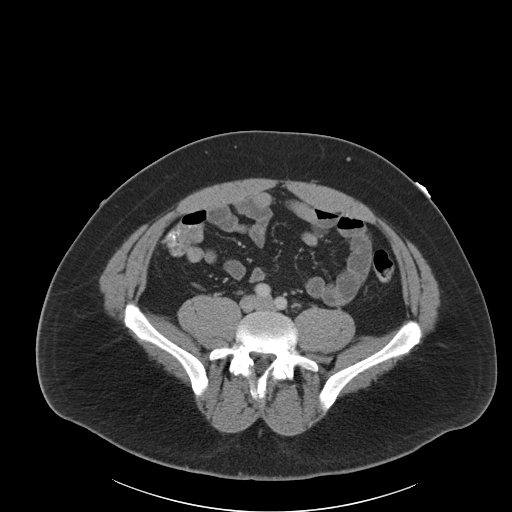
[im 45/96  soft-tissue]
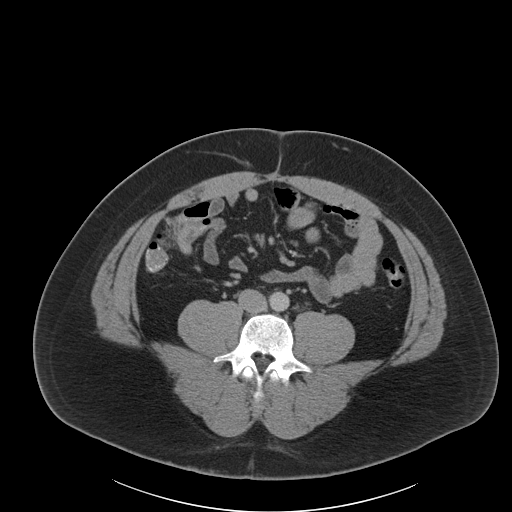
[im 51/96  soft-tissue]
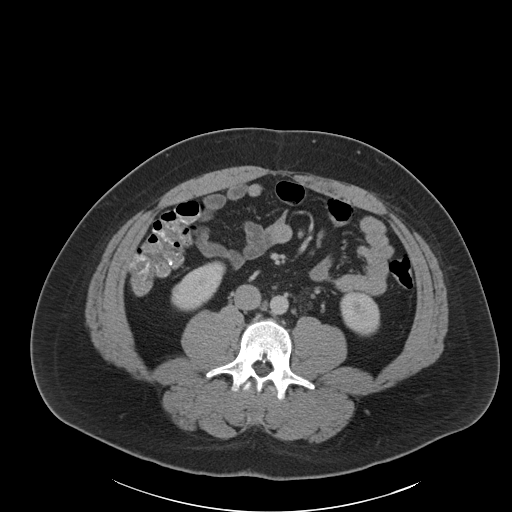
[im 56/96  soft-tissue]
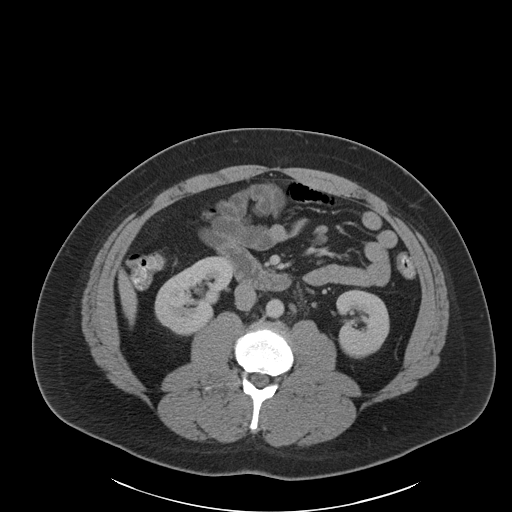
[im 56/96  bone]
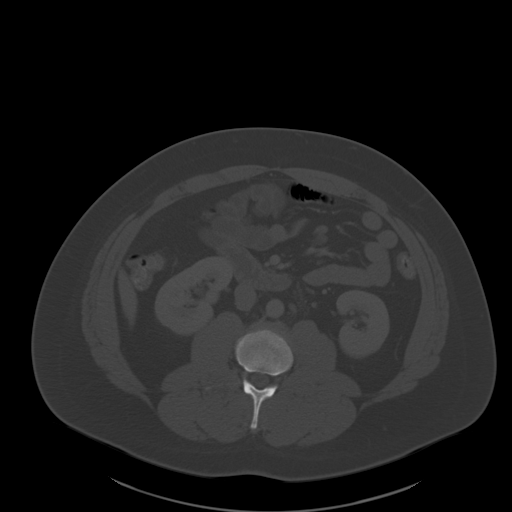
[im 62/96  soft-tissue]
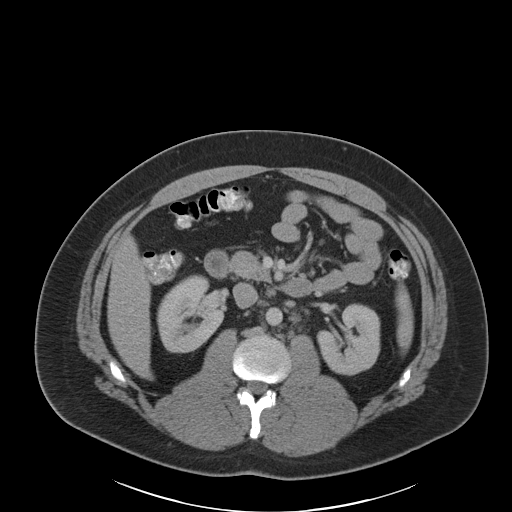
[im 73/96  soft-tissue]
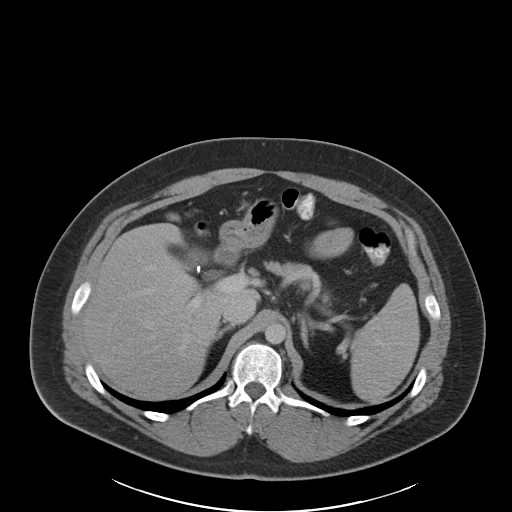
[im 79/96  soft-tissue]
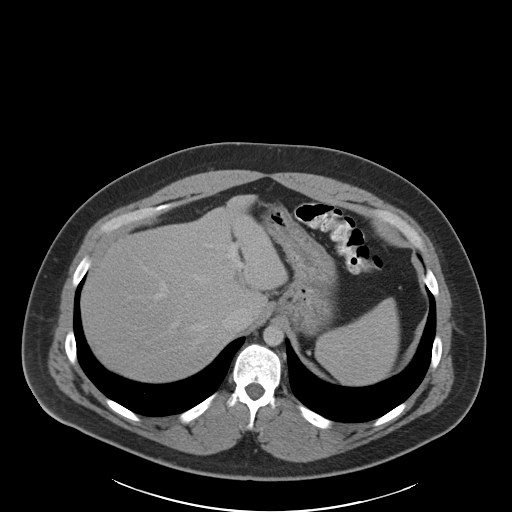
[im 84/96  soft-tissue]
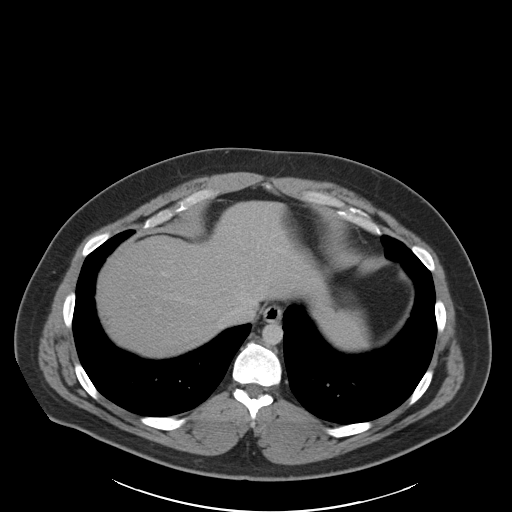
[im 90/96  soft-tissue]
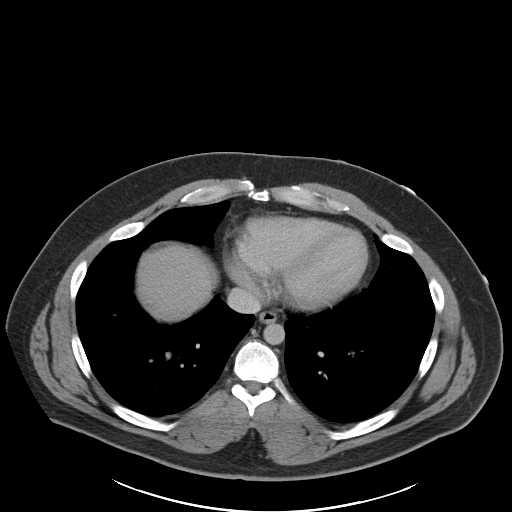

[Series 3: coronal a/|p · coronal · 0.87mm/px · 3 of 138 slices shown]
[im 46/138  soft-tissue]
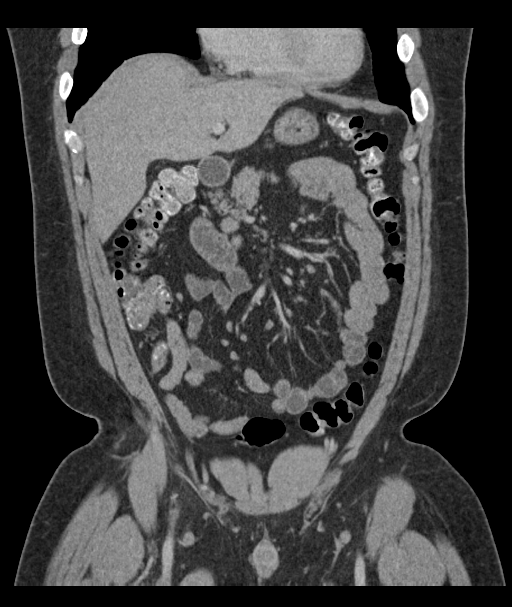
[im 61/138  soft-tissue]
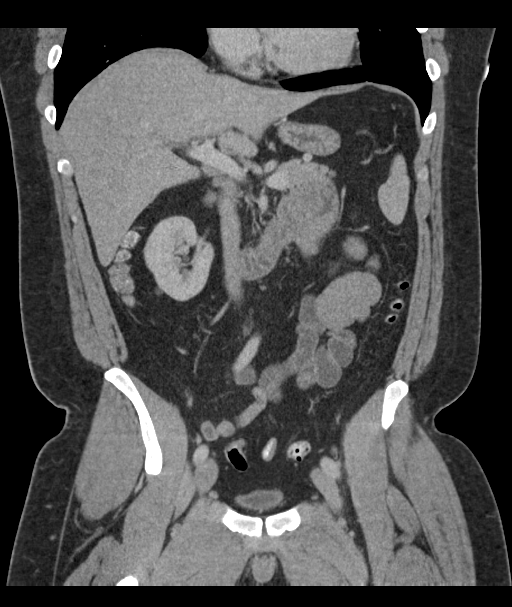
[im 77/138  soft-tissue]
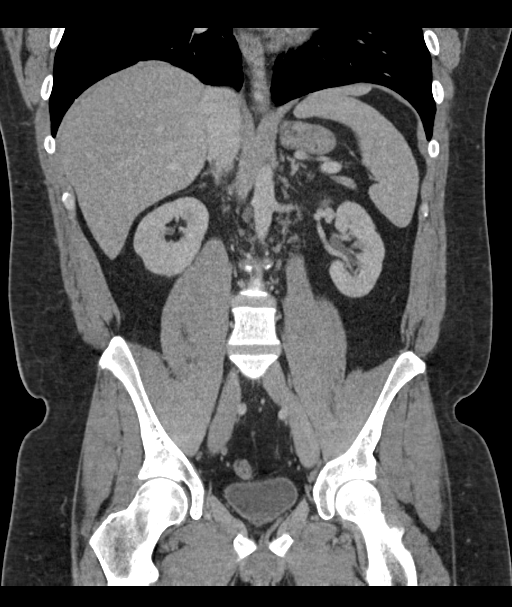

[17 of 46 positions shown; findings below may reference images not displayed]

FINDINGS: Lower chest: No acute abnormality.

Hepatobiliary: Postcholecystectomy.  Liver is unremarkable.

Pancreas: Unremarkable.

Spleen: Unremarkable.

Adrenals/Urinary Tract: Adrenal glands and kidneys are unremarkable.
Unremarkable bladder.

Stomach/Bowel: No evidence of small-bowel obstruction. No obvious
focal mass of the colon. Normal appendix.

Vascular/Lymphatic: No aortic aneurysm. There are several
subcentimeter short axis diameter para-aortic lymph nodes within the
abdomen. There is also significant stranding within the fat
surrounding the small lymph nodes. These findings are not
significantly changed compare with the prior MRI, and support benign
etiology.

Reproductive: Prostate is unremarkable.

Other: No free-fluid.

Musculoskeletal: L4-5 disc herniation asymmetric to the left.
Posterior disc bulge at L3-4. No vertebral compression deformity.
IMPRESSION: No acute intra-abdominal pathology.  Chronic changes are noted.

## 2019-03-10 ENCOUNTER — Ambulatory Visit: Payer: 59 | Attending: Internal Medicine

## 2019-03-10 DIAGNOSIS — Z23 Encounter for immunization: Secondary | ICD-10-CM

## 2019-03-10 NOTE — Progress Notes (Signed)
   Covid-19 Vaccination Clinic  Name:  Frank Clarke    MRN: [id number] DOB: 1980-05-30  03/10/2019  Mr. Frank Clarke was observed post Covid-19 immunization for 15 minutes without incidence. He was provided with Vaccine Information Sheet and instruction to access the V-Safe system.   Mr. Frank Clarke was instructed to call 911 with any severe reactions post vaccine: Marland Kitchen Difficulty breathing  . Swelling of your face and throat  . A fast heartbeat  . A bad rash all over your body  . Dizziness and weakness    Immunizations Administered    Name Date Dose VIS Date Route   Pfizer COVID-19 Vaccine 03/10/2019 11:29 AM 0.3 mL 12/30/2018 Intramuscular   Manufacturer: [employer], [employer]   Lot: [id number]   NDC: [address]-0

## 2019-04-04 ENCOUNTER — Ambulatory Visit: Payer: 59 | Attending: Internal Medicine

## 2019-04-04 DIAGNOSIS — Z23 Encounter for immunization: Secondary | ICD-10-CM

## 2019-04-04 NOTE — Progress Notes (Signed)
   Covid-19 Vaccination Clinic  Name:  DORON SHAKE    MRN: [id number] DOB: April 02, 1980  04/04/2019  Mr. Ekholm was observed post Covid-19 immunization for 15 minutes without incident. He was provided with Vaccine Information Sheet and instruction to access the V-Safe system.   Mr. Ayre was instructed to call 911 with any severe reactions post vaccine: Marland Kitchen Difficulty breathing  . Swelling of face and throat  . A fast heartbeat  . A bad rash all over body  . Dizziness and weakness   Immunizations Administered    Name Date Dose VIS Date Route   Pfizer COVID-19 Vaccine 04/04/2019  8:53 AM 0.3 mL 12/30/2018 Intramuscular   Manufacturer: [employer], [employer]   Lot: [id number]   NDC: [address]-4

## 2019-11-21 ENCOUNTER — Ambulatory Visit: Payer: 59 | Attending: Internal Medicine

## 2019-11-21 DIAGNOSIS — Z23 Encounter for immunization: Secondary | ICD-10-CM

## 2019-11-21 NOTE — Progress Notes (Signed)
   Covid-19 Vaccination Clinic  Name:  [name]    MRN: [id number] DOB: March 13, 1980  11/21/2019  Frank Clarke was observed post Covid-19 immunization for 15 minutes without incident. He was provided with Vaccine Information Sheet and instruction to access the V-Safe system.   Mr. Ewbank was instructed to call 911 with any severe reactions post vaccine: Marland Kitchen Difficulty breathing  . Swelling of face and throat  . A fast heartbeat  . A bad rash all over body  . Dizziness and weakness

## 2022-02-20 ENCOUNTER — Ambulatory Visit (INDEPENDENT_AMBULATORY_CARE_PROVIDER_SITE_OTHER): Payer: 59 | Admitting: Acute Care

## 2022-02-20 ENCOUNTER — Encounter: Payer: Self-pay | Admitting: Acute Care

## 2022-02-20 VITALS — BP 122/82 | HR 70 | Temp 98.5°F | Ht 71.0 in | Wt 253.0 lb

## 2022-02-20 DIAGNOSIS — R5383 Other fatigue: Secondary | ICD-10-CM

## 2022-02-20 DIAGNOSIS — R0683 Snoring: Secondary | ICD-10-CM

## 2022-02-20 NOTE — Progress Notes (Signed)
History of Present Illness Frank Clarke is a 42 y.o. male with history of sinusitis, hypertension,  referred by Frank Marlin PA-C for sleep evaluation.    02/20/2022 Pt. Presents for sleep consult. He was referred by his PCP for  chronic fatigue. 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 He primarily works daytime regular hours. On occasion there is some variability to his work hours. He lives with his wife and daughter. He states he does snore on occasion, no am headache. He has 2-3 night time awakenings that he feels are due to orthopedic pain and difficulty positioning. He states on a rare occasion he has woken up gasping for air.If he does he states it is usually early in his sleep cycle.  He states his weight fluctuates . He is currently down 10 pounds, but has fluctuations in his weight. He is currently dieting and watching what he eats.He has concerns that he cannot keep weight off once he loses it.  He did wake up spontaneously this morning at 3:30 am . He did not go back to sleep. He states this happens very rarely, maybe 2 times a year. He does feel rested today, but in general he wakens fatigued.He is very concerned about his fatigue over all because it just does not go away.He states awakens every day and he is tired.He is able to manage all his responsibilities, he just always feels tired.  His Epworth score is 9 We discussed home sleep study to evaluate for OSA. He is in agreement with testing. I have ordered the study. Plan will be to have him return to the office to review the results once we have the study read by one of our sleep doctors. He is in agreement with this plan.  Pt. Does endorse some loud snoring, sleepiness during the day with inactivity, and excessive sleepiness.His shirt collar size is > 27 inches.  He does not endorse restless sleep, or being aware of stopping breathing when he is asleep.   Sleep questionnaire 02/20/2022 Symptoms-  Fatigue, frequent night time  awakenings, some snoring Prior sleep study-  No Bedtime- 9:30-11:30 pm Time to fall asleep- 5-10 minutes Nocturnal awakenings- 2-3 per night  Out of bed/start of day- 5:30-6 am Weight changes- Frequent fluctuations , up and down , 10-15 pounds at a time  Do you operate heavy machinery- No Do you currently wear CPAP- No Do you current wear oxygen- No Epworth Score >>  9   Test Results:    02/20/2022    8:00 AM  Results of the Epworth flowsheet  Sitting and reading 1  Watching TV 1  Sitting, inactive in a public place (e.g. a theatre or a meeting) 0  As a passenger in a car for an hour without a break 2  Lying down to rest in the afternoon when circumstances permit 3  Sitting and talking to someone 0  Sitting quietly after a lunch without alcohol 1  In a car, while stopped for a few minutes in traffic 1  Total score 9        Latest Ref Rng & Units 01/10/2016   10:12 AM  CBC  WBC 4.0 - 10.5 K/uL 3.8   Hemoglobin 13.0 - 17.0 g/dL 15.2   Hematocrit 39.0 - 52.0 % 43.5   Platelets 150 - 400 K/uL 216        Latest Ref Rng & Units 01/10/2016   10:12 AM  BMP  Glucose 65 - 99 mg/dL  98   BUN 6 - 20 mg/dL 6   Creatinine 0.61 - 1.24 mg/dL 0.98   Sodium 135 - 145 mmol/L 139   Potassium 3.5 - 5.1 mmol/L 3.6   Chloride 101 - 111 mmol/L 102   CO2 22 - 32 mmol/L 27   Calcium 8.9 - 10.3 mg/dL 8.9     BNP No results found for: "BNP"  ProBNP No results found for: "PROBNP"  PFT No results found for: "FEV1PRE", "FEV1POST", "FVCPRE", "FVCPOST", "TLC", "DLCOUNC", "PREFEV1FVCRT", "PSTFEV1FVCRT"  No results found.   Past medical hx Past Medical History:  Diagnosis Date   Dental crowns present    Irritable bowel    Perianal abscess 10/2016   Sinus congestion 10/29/2016     Social History   Tobacco Use   Smoking status: Never   Smokeless tobacco: Never  Vaping Use   Vaping Use: Never used  Substance Use Topics   Alcohol use: Yes    Comment: 2 drinks/week   Drug  use: No    [name] reports that he has never smoked. He has never used smokeless tobacco. He reports current alcohol use. He reports that he does not use drugs.  Tobacco Cessation: Never smoker   Past surgical hx, Family hx, Social hx all reviewed.  Current Outpatient Medications on File Prior to Visit  Medication Sig   acetaminophen (TYLENOL) 325 MG tablet Take 650 mg by mouth every 6 (six) hours as needed.   celecoxib (CELEBREX) 200 MG capsule Take 1 tablet by mouth daily.   EPINEPHrine 0.3 mg/0.3 mL IJ SOAJ injection Inject 0.3 mg into the muscle as needed.   fluticasone (FLONASE) 50 MCG/ACT nasal spray Place into both nostrils daily.   hydrochlorothiazide (HYDRODIURIL) 25 MG tablet Take 1 tablet by mouth every morning.   triamcinolone ointment (KENALOG) 0.1 % Apply 1 Application topically 2 (two) times daily.   diclofenac (VOLTAREN) 75 MG EC tablet Take 75 mg by mouth 2 (two) times daily. (Patient not taking: Reported on 02/20/2022)   hyoscyamine (LEVSIN SL) 0.125 MG SL tablet Place 0.125 mg under the tongue daily as needed for cramping. (Patient not taking: Reported on 02/20/2022)   lidocaine (XYLOCAINE) 5 % ointment Apply 1 application topically 4 (four) times daily as needed. (Patient not taking: Reported on 02/20/2022)   oxyCODONE (OXY IR/ROXICODONE) 5 MG immediate release tablet Take 1-2 tablets (5-10 mg total) by mouth every 6 (six) hours as needed for moderate pain or severe pain. (Patient not taking: Reported on 02/20/2022)   No current facility-administered medications on file prior to visit.     No Known Allergies  Review Of Systems:  Constitutional:   +   weight loss, and weight gain, No night sweats,  Fevers, chills, ++fatigue, or  lassitude.  HEENT:   No headaches,  Difficulty swallowing,  Tooth/dental problems, or  Sore throat,                No sneezing, itching, ear ache, +nasal congestion, post nasal drip,   CV:  No chest pain,  Orthopnea, PND, swelling in lower  extremities, anasarca, dizziness, palpitations, syncope.   GI  No heartburn, indigestion, abdominal pain, nausea, vomiting, diarrhea, change in bowel habits, loss of appetite, bloody stools.   Resp: No shortness of breath with exertion or at rest.  No excess mucus, no productive cough,  No non-productive cough,  No coughing up of blood.  No change in color of mucus.  No wheezing.  No chest wall deformity  Skin:  no rash or lesions.  GU: no dysuria, change in color of urine, no urgency or frequency.  No flank pain, no hematuria   MS:  + joint pain or swelling.  No decreased range of motion.  + back pain.  Psych:  No change in mood or affect. No depression or anxiety.  No memory loss.   Vital Signs BP 122/82   Pulse 70   Temp 98.5 F (36.9 C) (Oral)   Ht 5\' 11"  (1.803 m)   Wt 253 lb (114.8 kg)   SpO2 98%   BMI 35.29 kg/m   Vital Signs reviewed, and within normal limits  Physical Exam:  General- No distress,  A&Ox3 ENT: No sinus tenderness, TM clear, pale nasal mucosa, no oral exudate,no post nasal drip, no LAN Cardiac: S1, S2, regular rate and rhythm, no murmur Chest: No wheeze/ rales/ dullness; no accessory muscle use, no nasal flaring, no sternal retractions Abd.: Soft Non-tender Ext: No clubbing cyanosis, edema Neuro:  normal strength Skin: No rashes, warm and dry Psych: normal mood and behavior   Assessment/Plan Fatigue with snoring Concern for OSA Epworth Score of 9 Plan I have ordered a sleep study. You will get a call when a machine is available. You will have a visit with a patient care coordinator for instructions on use.  You will use the machine the same night and return it the next morning for download. We will follow up and review results once we have them analyzed by one of our sleep physicians. If you have sleep apnea we will give you options for treatment.  Remember to establish a good bedtime routine, and work on sleep hygiene.  Limit daytime naps ,  avoid stimulants such as caffeine and nicotine close to bedtime, exercise daily to promote sleep quality, avoid heavy , spicy, fried , or rich foods before bed. Ensure adequate exposure to natural light during the day,establish a relaxing bedtime routine with a pleasant sleep environment ( Bedroom between 60 and 67 degrees, turn off bright lights , TV or device screens  ,consider black out curtains or white noise machines) Do not drive if sleepy. Follow up after sleep study has been completed. We will cal you to schedule  Please contact office for sooner follow up if symptoms do not improve or worsen or seek emergency care    I spent 30 minutes dedicated to the care of this patient on the date of this encounter to include pre-visit review of records, face-to-face time with the patient discussing conditions above, post visit ordering of testing, clinical documentation with the electronic health record, making appropriate referrals as documented, and communicating necessary information to the patient's healthcare team.   Magdalen Spatz, NP 02/20/2022  8:37 AM

## 2022-02-20 NOTE — Patient Instructions (Addendum)
It is good to see you today. I have ordered a sleep study. You will get a call when a machine is available. You will have a visit with a patient care coordinator for instructions on use.  You will use the machine the same night and return it the next morning for download. We will follow up and review results once we have them analyzed by one of our sleep physicians. If you have sleep apnea we will give you options for treatment.  Remember to establish a good bedtime routine, and work on sleep hygiene.  Limit daytime naps , avoid stimulants such as caffeine and nicotine close to bedtime, exercise daily to promote sleep quality, avoid heavy , spicy, fried , or rich foods before bed. Ensure adequate exposure to natural light during the day,establish a relaxing bedtime routine with a pleasant sleep environment ( Bedroom between 60 and 67 degrees, turn off bright lights , TV or device screens  ,consider black out curtains or white noise machines) Do not drive if sleepy. Follow up after sleep study has been completed.  Please contact office for sooner follow up if symptoms do not improve or worsen or seek emergency care

## 2022-02-20 NOTE — Progress Notes (Signed)
Reviewed and agree with assessment/plan.   Hiroki Wint, MD [facility] 02/20/2022, 9:31 AM Pager:  [phone]  

## 2022-03-17 ENCOUNTER — Telehealth: Payer: Self-pay | Admitting: Acute Care

## 2022-03-17 NOTE — Telephone Encounter (Signed)
Patient called to ask a few questions about the at home sleep study.  Please call patient to discuss at [phone]

## 2022-03-17 NOTE — Telephone Encounter (Signed)
I have called the patient and let him know his will be coming through snap

## 2022-04-08 ENCOUNTER — Encounter (INDEPENDENT_AMBULATORY_CARE_PROVIDER_SITE_OTHER): Payer: 59

## 2022-04-08 DIAGNOSIS — G4733 Obstructive sleep apnea (adult) (pediatric): Secondary | ICD-10-CM

## 2022-04-08 DIAGNOSIS — R5383 Other fatigue: Secondary | ICD-10-CM

## 2022-04-15 ENCOUNTER — Telehealth: Payer: Self-pay | Admitting: Acute Care

## 2022-04-15 NOTE — Telephone Encounter (Signed)
Patient would like results of home sleep test. Patient phone number is [phone].

## 2022-04-16 NOTE — Telephone Encounter (Signed)
Judson Roch can you please advise on Sleep study results

## 2022-04-21 NOTE — Telephone Encounter (Signed)
ATC X1 LVM for patient to call the office back. Please schedule with sarah for a video visit at 10 this thursday

## 2022-04-22 ENCOUNTER — Telehealth: Payer: Self-pay | Admitting: Acute Care

## 2022-04-22 ENCOUNTER — Ambulatory Visit (INDEPENDENT_AMBULATORY_CARE_PROVIDER_SITE_OTHER): Payer: 59 | Admitting: Acute Care

## 2022-04-22 ENCOUNTER — Encounter: Payer: Self-pay | Admitting: Acute Care

## 2022-04-22 DIAGNOSIS — G4733 Obstructive sleep apnea (adult) (pediatric): Secondary | ICD-10-CM

## 2022-04-22 NOTE — Telephone Encounter (Signed)
We will place an order for a CPAP machine to start therapy for your Obstructive sleep apnea.  We will order therapy in the AutoSet Mode>> 5-15 cm H2O>> this setting adjusts therapy pressure to what it senses that you need.  We will start with a  nasal pillow face mask, and check your results after 4 weeks of therapy to see if we need to make adjustments.  We can place an order for a mask fitting if needed after reviewing your initial download.  We will place an order for all equipment and supplies  in addition to the CPAP machine itself. We will place an order for [employer] ( Internet that can send [place] reports on therapy) to help [place] monitor your therapy.  You should get a call from a [facility] ( DME)  to get your machine and supplies set up.   Thanks so much

## 2022-04-22 NOTE — Telephone Encounter (Signed)
Pt called the office and I let him know the info per Judson Roch about scheduling a video visit for him tomorrow 4/4 at 10am with her. Pt will need to have link sent to mobile number of [phone].  Routing to Judson Roch as an FYI that this pt has been scheduled as requested for the video visit 4/4 at 10:00.

## 2022-04-22 NOTE — Telephone Encounter (Signed)
I attempted to call the patient with the results of the sleep study. There was no answer. I have left a HIPAA compliant VM with the office contact number. I have asked him to call back so I can go over the results.

## 2022-04-22 NOTE — Patient Instructions (Addendum)
It was good to talk with you today. Your sleep study showed that you have mild sleep apnea. AHI was 9.6 and oxygen saturation dropped as low as 87%. We will place an order for a CPAP machine to start therapy for your Obstructive sleep apnea.  We will order therapy in the AutoSet Mode>> this setting adjusts therapy pressure to what it senses that you need.  We will start with a  nasal pillow face mask, and check your results after 4 weeks of therapy to see if we need to make adjustments.  We can place an order for a mask fitting if needed after reviewing your initial download.  We will place an order for all equipment and supplies  in addition to the CPAP machine itself. We will place an order for [employer] ( Internet that can send [place] reports on therapy) to help [place] monitor your therapy.  You should get a call from a [place] ( DME)  to get your machine and supplies set up.    Start  on CPAP at bedtime. Goal is to wear for at least 6 hours each night for maximal clinical benefit. Continue to work on weight loss, as the link between excess weight  and sleep apnea is well established.   Remember to establish a good bedtime routine, and work on sleep hygiene.  Limit daytime naps , avoid stimulants such as caffeine and nicotine close to bedtime, exercise daily to promote sleep quality, avoid heavy , spicy, fried , or rich foods before bed. Ensure adequate exposure to natural light during the day,establish a relaxing bedtime routine with a pleasant sleep environment ( Bedroom between 60 and 67 degrees, turn off bright lights , TV or device screens screens , consider black out curtains or white noise machines) Do not drive if sleepy. Remember to clean mask, tubing, filter, and reservoir once weekly with soapy water.  Follow up with Judson Roch NP In  6-8 weeks or before as needed to evaluate the initial Down Load.   We have to do this within 9 weeks of initiation of therapy per  insurance regulations.   Call us if you need [place] sooner or if you do not hear from your DME supplier.  Please contact office for sooner follow up if symptoms do not improve or worsen or seek emergency care

## 2022-04-22 NOTE — Telephone Encounter (Signed)
PT is ret Ms. Frank Clarke call. His # is [phone] (I Epic Messaged her also.)

## 2022-04-22 NOTE — Progress Notes (Signed)
Virtual Visit via Telephone Note  I connected with Frank Clarke on 04/22/22 at  1:30 PM EDT by telephone and verified that I am speaking with the correct person using two identifiers.  [place] Provider: [place], [address], [place], [address]    I discussed the limitations, risks, security and privacy concerns of performing an evaluation and management service by telephone and the availability of in person appointments. I also discussed with the patient that there may be a patient responsible charge related to this service. The patient expressed understanding and agreed to proceed.   History of Present Illness: Frank Clarke is a 42 y.o. male with history of sinusitis, hypertension,  referred by Mattie Marlin PA-C for sleep evaluation. Weight at time of sleep evaluation was 253 pounds with a BMI of 35.29.  Pt. Seen for sleep consult 02/20/2022. Epworth Score was 9 Pt. Was symptomatic for fatigue, frequent night time awakenings, 2-3 per night, and snoring. He has rare awakenings gasping for air.  Sleep study was done through [facility] on 03/30/2022.    Results were read by Dr. Halford Chessman. AHI of 9.6 with oxygen saturation nadir of  87% . Mild OSA with nocturnal desaturations We discussed the results and the options of CPAP therapy vs oral appliance vs ENT for surgery. He was seen recently by ENT, and had bilateral nasal turbinate reduction on 03/19/2022. He is recovering well from this. He initially felt his breathing was better, but subsequently does not think it is helping. He would like to go ahead and start CPAP therapy.    Observations/Objective: Sleep Study  03/30/2022>>results AHI of 9.6 Saturation nadir of 87%  Assessment and Plan: Mild OSA per sleep study AHI of 9.6 with Oxygen saturation nadir of 87% Plan Your sleep study showed that you have mild sleep apnea. AHI was 9.6 and oxygen saturation dropped as low as 87%. We will place an  order for a CPAP machine to start therapy for your Obstructive sleep apnea.  We will order therapy in the AutoSet Mode>> 5-15 cm H2O>> this setting adjusts therapy pressure to what it senses that you need.  We will start with a  nasal pillow face mask, and check your results after 4 weeks of therapy to see if we need to make adjustments.  We can place an order for a mask fitting if needed after reviewing your initial download.  We will place an order for all equipment and supplies  in addition to the CPAP machine itself. We will place an order for [employer] ( Internet that can send [place] reports on therapy) to help [place] monitor your therapy.  You should get a call from a [place] ( DME)  to get your machine and supplies set up.    Start  on CPAP at bedtime. Goal is to wear for at least 6 hours each night for maximal clinical benefit. Continue to work on weight loss, as the link between excess weight  and sleep apnea is well established.   Remember to establish a good bedtime routine, and work on sleep hygiene.  Limit daytime naps , avoid stimulants such as caffeine and nicotine close to bedtime, exercise daily to promote sleep quality, avoid heavy , spicy, fried , or rich foods before bed. Ensure adequate exposure to natural light during the day,establish a relaxing bedtime routine with a pleasant sleep environment ( Bedroom between 60 and 67 degrees, turn off bright lights ,  TV or device screens screens , consider black out curtains or white noise machines) Do not drive if sleepy. Remember to clean mask, tubing, filter, and reservoir once weekly with soapy water.  Follow up with Judson Roch NP In  6-8 weeks or before as needed to evaluate the initial Down Load.   We have to do this within 9 weeks of initiation of therapy per insurance regulations.   Call us if you need [place] sooner or if you do not hear from your DME supplier.  Please contact office for sooner follow up if symptoms do  not improve or worsen or seek emergency care    Follow Up Instructions: Follow up with Judson Roch NP In  6-8 weeks or before as needed to evaluate the initial Down Load.   We have to do this within 9 weeks of initiation of therapy per insurance regulations.     I discussed the assessment and treatment plan with the patient. The patient was provided an opportunity to ask questions and all were answered. The patient agreed with the plan and demonstrated an understanding of the instructions.   The patient was advised to call back or seek an in-person evaluation if the symptoms worsen or if the condition fails to improve as anticipated.  I spent 35 minutes  minutes dedicated to the care of this patient on the date of this encounter to include pre-visit review of records, non- face-to-face time with the patient discussing conditions above, post visit ordering of testing, clinical documentation with the electronic health record, making appropriate referrals as documented, and communicating necessary information to the patient's healthcare team.    Magdalen Spatz, NP  04/22/2022

## 2022-04-22 NOTE — Telephone Encounter (Signed)
Spoke with pt who stated he was returning [employer]. Checked with Judson Roch to see if she was available to do the visit now and she said she was available.  Changed pt's appt to today 4/3 instead of 4/4 and transferred pt to [place] for the visit. Nothing further needed.

## 2022-04-22 NOTE — Telephone Encounter (Signed)
Called and spoke with pt. Pt agreed and verbalized understanding about cpap machine. Order has been placed nothing further needed

## 2022-04-23 ENCOUNTER — Telehealth: Payer: 59 | Admitting: Acute Care

## 2022-08-18 ENCOUNTER — Ambulatory Visit: Payer: 59 | Admitting: Acute Care

## 2022-08-18 ENCOUNTER — Encounter: Payer: Self-pay | Admitting: Acute Care

## 2022-08-18 VITALS — BP 110/80 | HR 77 | Ht 71.0 in | Wt 271.4 lb

## 2022-08-18 DIAGNOSIS — G4733 Obstructive sleep apnea (adult) (pediatric): Secondary | ICD-10-CM | POA: Diagnosis not present

## 2022-08-18 NOTE — Progress Notes (Signed)
History of Present Illness Frank Clarke is a 42 y.o. male with history of sinusitis, hypertension,  referred by Frank Cashing PA-C for sleep evaluation.   Synopsis Pt. Referred for sleep evaluation 04/2022. Pt. Seen for sleep consult 02/20/2022. Epworth Score was 9 Pt. Was symptomatic for fatigue, frequent night time awakenings, 2-3 per night, and snoring. He has rare awakenings gasping for air.  Sleep study was done through [facility] on 03/30/2022.    Results were read by Dr. Craige Clarke. AHI of 9.6 with oxygen saturation nadir of  87% . Mild OSA with nocturnal desaturations We discussed the results and the options of CPAP therapy vs oral appliance vs ENT for surgery. He was seen recently by ENT, and had bilateral nasal turbinate reduction on 03/19/2022. He is recovering well from this. He initially felt his breathing was better, but subsequently does not think it is helping. He was started on CPAP therapy.He received his CPAP device    08/18/2022 Pt. Presents for follow up after starting CPAP. He received his device on a while ago, but has not been able to wear it much due to orthopedic issues that are preventing him from being able to sleep for long periods of time. Down Load shows he is wearing CPAP  11 of 30 days. Average time used in 4 hours 20 minutes.  Down Load shows when worn his CPAP controls his OSA to 2.3 events per hour. He states he has multiple orthopedic appointments to get these issues evaluated and hopefully treated.He is being evaluated for ablation of nerves in  his back. He is also having his shoulder and knee evaluated. He hopes as he gets these treated he will be able to wear his CPAP more as he will have less interrupted sleep.Marland Kitchen He does not have any issues with the CPAP treatment itself. He feels mask fit is good. He feels pressures are adequate.   Test Results: Down Load 07/17/2022-08/15/2022      Observations/Objective: Sleep Study  03/30/2022>>results AHI of 9.6 Saturation  nadir of 87%     Latest Ref Rng & Units 01/10/2016   10:12 AM  CBC  WBC 4.0 - 10.5 K/uL 3.8   Hemoglobin 13.0 - 17.0 g/dL 29.5   Hematocrit 28.4 - 52.0 % 43.5   Platelets 150 - 400 K/uL 216        Latest Ref Rng & Units 01/10/2016   10:12 AM  BMP  Glucose 65 - 99 mg/dL 98   BUN 6 - 20 mg/dL 6   Creatinine 1.32 - 4.40 mg/dL 1.02   Sodium 725 - 366 mmol/L 139   Potassium 3.5 - 5.1 mmol/L 3.6   Chloride 101 - 111 mmol/L 102   CO2 22 - 32 mmol/L 27   Calcium 8.9 - 10.3 mg/dL 8.9     BNP No results found for: "BNP"  ProBNP No results found for: "PROBNP"  PFT No results found for: "FEV1PRE", "FEV1POST", "FVCPRE", "FVCPOST", "TLC", "DLCOUNC", "PREFEV1FVCRT", "PSTFEV1FVCRT"  No results found.   Past medical hx Past Medical History:  Diagnosis Date   Dental crowns present    Irritable bowel    Perianal abscess 10/2016   Sinus congestion 10/29/2016     Social History   Tobacco Use   Smoking status: Never   Smokeless tobacco: Never  Vaping Use   Vaping status: Never Used  Substance Use Topics   Alcohol use: Yes    Comment: 2 drinks/week   Drug use: No    Frank Clarke  reports that he has never smoked. He has never used smokeless tobacco. He reports current alcohol use. He reports that he does not use drugs.  Tobacco Cessation: Never smoker   Past surgical hx, Family hx, Social hx all reviewed.  Current Outpatient Medications on File Prior to Visit  Medication Sig   acetaminophen (TYLENOL) 325 MG tablet Take 650 mg by mouth every 6 (six) hours as needed.   celecoxib (CELEBREX) 200 MG capsule Take 1 tablet by mouth daily.   EPINEPHrine 0.3 mg/0.3 mL IJ SOAJ injection Inject 0.3 mg into the muscle as needed.   fluticasone (FLONASE) 50 MCG/ACT nasal spray Place into both nostrils daily.   hydrochlorothiazide (HYDRODIURIL) 25 MG tablet Take 1 tablet by mouth every morning.   triamcinolone ointment (KENALOG) 0.1 % Apply 1 Application topically 2 (two) times  daily.   diclofenac (VOLTAREN) 75 MG EC tablet Take 75 mg by mouth 2 (two) times daily. (Patient not taking: Reported on 02/20/2022)   hyoscyamine (LEVSIN SL) 0.125 MG SL tablet Place 0.125 mg under the tongue daily as needed for cramping. (Patient not taking: Reported on 02/20/2022)   lidocaine (XYLOCAINE) 5 % ointment Apply 1 application topically 4 (four) times daily as needed. (Patient not taking: Reported on 02/20/2022)   oxyCODONE (OXY IR/ROXICODONE) 5 MG immediate release tablet Take 1-2 tablets (5-10 mg total) by mouth every 6 (six) hours as needed for moderate pain or severe pain. (Patient not taking: Reported on 02/20/2022)   No current facility-administered medications on file prior to visit.     No Known Allergies  Review Of Systems:  Constitutional:   No  weight loss, night sweats,  Fevers, chills, fatigue, or  lassitude.  HEENT:   No headaches,  Difficulty swallowing,  Tooth/dental problems, or  Sore throat,                No sneezing, itching, ear ache, nasal congestion, post nasal drip,   CV:  No chest pain,  Orthopnea, PND, swelling in lower extremities, anasarca, dizziness, palpitations, syncope.   GI  No heartburn, indigestion, abdominal pain, nausea, vomiting, diarrhea, change in bowel habits, loss of appetite, bloody stools.   Resp: No shortness of breath with exertion or at rest.  No excess mucus, no productive cough,  No non-productive cough,  No coughing up of blood.  No change in color of mucus.  No wheezing.  No chest wall deformity  Skin: no rash or lesions.  GU: no dysuria, change in color of urine, no urgency or frequency.  No flank pain, no hematuria   MS:  + joint pain no  swelling.  No decreased range of motion.  + back pain.  Psych:  No change in mood or affect. No depression or anxiety.  No memory loss.   Vital Signs BP 110/80 (BP Location: Left Arm)   Pulse 77   Ht 5\' 11"  (1.803 m)   Wt 271 lb 6.4 oz (123.1 kg)   SpO2 95%   BMI 37.85 kg/m     Physical Exam:  General- No distress,  A&Ox3, pleasant ENT: No sinus tenderness, TM clear, pale nasal mucosa, no oral exudate,no post nasal drip, no LAN Cardiac: S1, S2, regular rate and rhythm, no murmur Chest: No wheeze/ rales/ dullness; no accessory muscle use, no nasal flaring, no sternal retractions Abd.: Soft Non-tender, ND, BS +, Body mass index is 37.85 kg/m.  Ext: No clubbing cyanosis, edema Neuro:  normal strength, MAE x 4, A&O x 3, appropriate Skin:  No rashes, warm and dry, no lesions  Psych: normal mood and behavior   Assessment/Plan  Mild OSA on CPAP Poor compliance due to orthopedic issues making uninterrupted sleep difficult Good control of AHI when used Plan We have reviewed your CPAP Down Load  When worn, this controls your AHI to 2.3 events per hour.  This is great. We will do a follow up appointment in 6 months( 01/2023) with down load to see if you are able to wear this more.  This will be  after you have been seen for you numerous orthopedic issues. Hopefully treatment of these orthopedic  issues  will allow for better compliance and benefit from CPAP therapy. Remember we can talk about Earnest Bailey if you have any interest in that if the CPAP therapy does not work.  Follow up in 6 months  Continue on CPAP at bedtime. You appear to be benefiting from the treatment  Goal is to wear for at least 4 hours each night for maximal clinical benefit. Continue to work on weight loss, as the link between excess weight  and sleep apnea is well established.   Remember to establish a good bedtime routine, and work on sleep hygiene.  Limit daytime naps , avoid stimulants such as caffeine and nicotine close to bedtime, exercise daily to promote sleep quality, avoid heavy , spicy, fried , or rich foods before bed. Ensure adequate exposure to natural light during the day,establish a relaxing bedtime routine with a pleasant sleep environment ( Bedroom between 60 and 67 degrees, turn  off bright lights , TV or device screens screens , consider black out curtains or white noise machines) Do not drive if sleepy. Remember to clean mask, tubing, filter, and reservoir once weekly with soapy water.  Follow up with  Maralyn Sago NP   In 6 or before as needed.    Please contact office for sooner follow up if symptoms do not improve or worsen or seek emergency care    I spent 30 minutes dedicated to the care of this patient on the date of this encounter to include pre-visit review of records, face-to-face time with the patient discussing conditions above, post visit ordering of testing, clinical documentation with the electronic health record, making appropriate referrals as documented, and communicating necessary information to the patient's healthcare team.    Bevelyn Ngo, NP 08/18/2022  2:06 PM

## 2022-08-18 NOTE — Patient Instructions (Addendum)
It is good to  see you today. We have reviewed your CPAP Down Load  When worn, this controls your AHI to 2.3 events per hour.  This is great. We will do a follow up appointment in 6 months( 01/2023) with down load to see if you are able to wear this more.  Remember we can talk about Frank Clarke if you have any interest in that if the CPAP therapy does not work.  Follow up in 6 months  Continue on CPAP at bedtime. You appear to be benefiting from the treatment  Goal is to wear for at least 4 hours each night for maximal clinical benefit. Continue to work on weight loss, as the link between excess weight  and sleep apnea is well established.   Remember to establish a good bedtime routine, and work on sleep hygiene.  Limit daytime naps , avoid stimulants such as caffeine and nicotine close to bedtime, exercise daily to promote sleep quality, avoid heavy , spicy, fried , or rich foods before bed. Ensure adequate exposure to natural light during the day,establish a relaxing bedtime routine with a pleasant sleep environment ( Bedroom between 60 and 67 degrees, turn off bright lights , TV or device screens screens , consider black out curtains or white noise machines) Do not drive if sleepy. Remember to clean mask, tubing, filter, and reservoir once weekly with soapy water.  Follow up with  Frank Sago NP   In 6 or before as needed.    Please contact office for sooner follow up if symptoms do not improve or worsen or seek emergency care

## 2022-08-28 NOTE — Progress Notes (Signed)
Reviewed and agree with assessment/plan.   Coralyn Helling, MD [facility] Pulmonary/Critical Care 08/28/2022, 3:48 PM Pager:  [phone]

## 2022-09-07 ENCOUNTER — Other Ambulatory Visit (INDEPENDENT_AMBULATORY_CARE_PROVIDER_SITE_OTHER): Payer: 59

## 2022-09-07 ENCOUNTER — Encounter: Payer: Self-pay | Admitting: Orthopaedic Surgery

## 2022-09-07 ENCOUNTER — Ambulatory Visit: Payer: 59 | Admitting: Orthopaedic Surgery

## 2022-09-07 VITALS — Ht 71.0 in | Wt 266.0 lb

## 2022-09-07 DIAGNOSIS — M25561 Pain in right knee: Secondary | ICD-10-CM

## 2022-09-07 DIAGNOSIS — G8929 Other chronic pain: Secondary | ICD-10-CM

## 2022-09-07 DIAGNOSIS — M25562 Pain in left knee: Secondary | ICD-10-CM | POA: Diagnosis not present

## 2022-09-07 DIAGNOSIS — M1712 Unilateral primary osteoarthritis, left knee: Secondary | ICD-10-CM | POA: Diagnosis not present

## 2022-09-07 DIAGNOSIS — M1711 Unilateral primary osteoarthritis, right knee: Secondary | ICD-10-CM | POA: Diagnosis not present

## 2022-09-07 NOTE — Progress Notes (Signed)
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  He has had 3 arthroscopic surgeries on his right knee and one on his left knee done elsewhere.  He has chronic constant achy pain in the right knee more than the left knee.  The right knee does catch on him as well.  Original surgery was for a plica but then eventually was for cleanup types of procedures and meniscal tearing.  He says his right knee hurts worse than the left knee but they do ache on a daily basis.  He does deal with chronic right shoulder issues and gets a steroid injection on occasion with his right shoulder.  He has had steroid injections for his knees in the past.  He is otherwise a healthy 43 year old gentleman.  He has no significant active medical issues.  Examination of both knees showed just a slight effusion on the right knee and not 1 in the left knee.  Both knees have significant patellofemoral crepitation which is again worse on the right than left.  The right knee has just slight valgus malalignment.  This is only slight.  There is global tenderness on the joint line of both knees.  He has a lot of pain past 90 degrees of flexion on the right knee.  X-rays of both knees surprisingly shows significant tricompartment arthritis.  There are osteophytes in all 3 compartments especially the patellofemoral joint.  He understands he does have significant arthritis in his knees.  The next step is considering hyaluronic acid for his knees to treat the pain from osteoarthritis.  He would like to try this as well having failed other conservative treatment measures.  We will work on getting him set up for those types of injections.  This patient is diagnosed with osteoarthritis of the knee(s).    Radiographs show evidence of joint space narrowing, osteophytes, subchondral sclerosis and/or subchondral cysts.  This patient has knee pain which interferes with functional and activities of  daily living.    This patient has experienced inadequate response, adverse effects and/or intolerance with conservative treatments such as acetaminophen, NSAIDS, topical creams, physical therapy or regular exercise, knee bracing and/or weight loss.   This patient has experienced inadequate response or has a contraindication to intra articular steroid injections for at least 3 months.   This patient is not scheduled to have a total knee replacement within 6 months of starting treatment with viscosupplementation.

## 2022-09-08 ENCOUNTER — Telehealth: Payer: Self-pay

## 2022-09-08 NOTE — Telephone Encounter (Signed)
Bilateral knee gel injections 

## 2022-09-09 NOTE — Telephone Encounter (Signed)
VOB submitted for Durolane, bilateral knee  

## 2022-09-23 ENCOUNTER — Other Ambulatory Visit: Payer: Self-pay

## 2022-09-23 DIAGNOSIS — M1711 Unilateral primary osteoarthritis, right knee: Secondary | ICD-10-CM

## 2022-09-23 DIAGNOSIS — M1712 Unilateral primary osteoarthritis, left knee: Secondary | ICD-10-CM

## 2022-10-01 ENCOUNTER — Ambulatory Visit: Payer: 59 | Admitting: Physician Assistant

## 2022-10-05 ENCOUNTER — Ambulatory Visit: Payer: 59 | Admitting: Physician Assistant

## 2022-10-05 ENCOUNTER — Encounter: Payer: Self-pay | Admitting: Physician Assistant

## 2022-10-05 DIAGNOSIS — M1712 Unilateral primary osteoarthritis, left knee: Secondary | ICD-10-CM | POA: Diagnosis not present

## 2022-10-05 DIAGNOSIS — M1711 Unilateral primary osteoarthritis, right knee: Secondary | ICD-10-CM

## 2022-10-05 MED ORDER — SODIUM HYALURONATE 60 MG/3ML IX PRSY
60.0000 mg | PREFILLED_SYRINGE | INTRA_ARTICULAR | Status: AC | PRN
  Administered 2022-10-05: 60 mg via INTRA_ARTICULAR

## 2022-10-05 NOTE — Progress Notes (Signed)
Procedure Note  Patient: Frank Clarke             Date of Birth: Aug 31, 1980           MRN: [id number]             Visit Date: 10/05/2022  HPI: Korey is a 42 year old male comes in today for bilateral knee pain.  He has significant tricompartmental arthritis of both knees.  He has tried cortisone injections: Knee arthroscopy and continues to have pain in both knees.  He takes Tylenol and Celebrex daily.  Currently his right knee is more bothersome than the left.  He has no scheduled surgery on either knee 6 months.  He is scheduled for Durolane injections both knees  Physical exam: Bilateral knees no abnormal warmth erythema or effusion good range of motion.  Procedures: Visit Diagnoses:  1. Unilateral primary osteoarthritis, right knee   2. Unilateral primary osteoarthritis, left knee     Large Joint Inj: bilateral knee on 10/05/2022 4:36 PM Indications: pain Details: 22 G 1.5 in needle, anterolateral approach  Arthrogram: No  Medications (Right): 60 mg Sodium Hyaluronate 60 MG/3ML Medications (Left): 60 mg Sodium Hyaluronate 60 MG/3ML Outcome: tolerated well, no immediate complications Procedure, treatment alternatives, risks and benefits explained, specific risks discussed. Consent was given by the patient. Immediately prior to procedure a time out was called to verify the correct patient, procedure, equipment, support staff and site/side marked as required. Patient was prepped and draped in the usual sterile fashion.     Plan: He understands wait 6 months between supplemental injections.  Follow-up with [place] as needed.  Questions encouraged and answered at length.

## 2023-03-15 ENCOUNTER — Encounter: Payer: Self-pay | Admitting: Physician Assistant

## 2023-03-15 ENCOUNTER — Ambulatory Visit: Payer: 59 | Admitting: Physician Assistant

## 2023-03-15 DIAGNOSIS — M25571 Pain in right ankle and joints of right foot: Secondary | ICD-10-CM | POA: Diagnosis not present

## 2023-03-15 DIAGNOSIS — M1711 Unilateral primary osteoarthritis, right knee: Secondary | ICD-10-CM

## 2023-03-15 DIAGNOSIS — M79671 Pain in right foot: Secondary | ICD-10-CM

## 2023-03-15 MED ORDER — METHYLPREDNISOLONE ACETATE 40 MG/ML IJ SUSP
40.0000 mg | INTRAMUSCULAR | Status: AC | PRN
  Administered 2023-03-15: 40 mg via INTRA_ARTICULAR

## 2023-03-15 MED ORDER — LIDOCAINE HCL 1 % IJ SOLN
0.5000 mL | INTRAMUSCULAR | Status: AC | PRN
  Administered 2023-03-15: .5 mL

## 2023-03-15 NOTE — Progress Notes (Signed)
 Office Visit Note   Patient: Frank Clarke           Date of Birth: [birth year]           MRN: [id number] Visit Date: 03/15/2023              Requested by: Frank Inch, MD [address] [place],  [place] [address] PCP: Frank Inch, MD   Assessment & Plan: Visit Diagnoses:  1. Sinus tarsi syndrome, right   2. Right foot pain   3. Unilateral primary osteoarthritis, right knee     Plan: In regards to subtalar pain recommend he obtain off-the-shelf inserts for both feet meets with the inserts with medial arch support.  Given his failure of right knee conservative treatment which is included time, anti-inflammatories, cortisone injection and viscosupplementation injection he is wanting to proceed with right total knee arthroplasty sometime in June.  Risk benefits of surgery discussed.  Risk include but are not limited to nerve/vessel injury, wound healing problems, prolonged pain, blood loss, DVT/PE and infection.  He will call us and let us know when he would like to proceed exactly with surgery and we will fill out a surgical sheet at that point in time.  Questions encouraged and answered at length  Follow-Up Instructions: Return for post op.   Orders:  Orders Placed This Encounter  Procedures   Small Joint Inj: R subtalar   Meds ordered this encounter  Medications   lidocaine (XYLOCAINE) 1 % (with pres) injection 0.5 mL   methylPREDNISolone acetate (DEPO-MEDROL) injection 40 mg      Procedures: Small Joint Inj: R subtalar on 03/15/2023 4:28 PM Medications: 0.5 mL lidocaine 1 %; 40 mg methylPREDNISolone acetate 40 MG/ML Consent was given by the patient. Immediately prior to procedure a time out was called to verify the correct patient, procedure, equipment, support staff and site/side marked as required. Patient was prepped and draped in the usual sterile fashion.       Clinical Data: No additional findings.   Subjective: Chief Complaint   Patient presents with   Right Knee - Pain    HPI Frank Clarke is a 43 year old male comes in today with right knee pain.  Had viscosupplementation both knees 10/05/2022.  States that the injection helped until 09/07/2022 but now he is having constant 2-3 out of 10 pain and 9 out of 10 pain at times in the right knee.  Left knee overall is doing well still.  He continues to take Celebrex 200 mg daily Tylenol 1000 mg daily.  He has tried Voltaren gel without any real relief.  He is also tried to do some working out at [employer]  He has found that he is unable to do squats lunges due to the pain in the knee.  He states knee catches.  Mostly has lateral pain.  Feels the knee is weak. Prior radiographs of the right knee showed tricompartmental arthritis.  Patellofemoral joint severe arthritis with osteophytes off the inferior proximal pole patella.  Mild to moderate narrowing medial joint line.  Lateral compartment overall well-preserved with large osteophytes off the lateral margins.  Review of Systems Denies any fevers chills, chest pain or shortness of breath.  Objective: Vital Signs: There were no vitals taken for this visit.  Physical Exam Constitutional:      Appearance: He is not ill-appearing or diaphoretic.  Pulmonary:     Effort: Pulmonary effort is normal.  Neurological:     Mental  Status: He is alert and oriented to person, place, and time.  Psychiatric:        Behavior: Behavior normal.     Ortho Exam Right knee: Tenderness along medial joint line no instability valgus varus stressing.  He has overall good range of motion of the knee.  Patellofemoral crepitus with passive range of motion.  No abnormal warmth erythema or effusion.  Right foot has 5 out of 5 strength with inversion eversion against resistance.  Tenderness at the peroneal brevis insertion.  Pes planus bilaterally.  No tenderness over the posterior tibial tendons bilaterally.  Tenderness over the right sinus Tarsi region.   There is no rashes skin lesions ulcerations.  Specialty Comments:  No specialty comments available.  Imaging: No results found.   PMFS History: There are no active problems to display for this patient.  Past Medical History:  Diagnosis Date   Dental crowns present    Irritable bowel    Perianal abscess 10/2016   Sinus congestion 10/29/2016    History reviewed. No pertinent family history.  Past Surgical History:  Procedure Laterality Date   ASD REPAIR     age 13   CHOLECYSTECTOMY     EVALUATION UNDER ANESTHESIA WITH FISTULECTOMY Left 11/04/2016   Procedure: EXAM UNDER ANESTHESIA WITH FISTULOTOMY;  Surgeon: Abigail Miyamoto, MD;  Location: [facility];  Service: General;  Laterality: Left;   KNEE ARTHROSCOPY Right    x 2   KNEE ARTHROSCOPY Left    SHOULDER ARTHROSCOPY Right    Social History   Occupational History   Not on file  Tobacco Use   Smoking status: Never   Smokeless tobacco: Never  Vaping Use   Vaping status: Never Used  Substance and Sexual Activity   Alcohol use: Yes    Comment: 2 drinks/week   Drug use: No   Sexual activity: Not on file

## 2023-03-19 ENCOUNTER — Telehealth: Payer: Self-pay

## 2023-03-19 NOTE — Telephone Encounter (Signed)
 I called patient to discuss surgery date.  He will speak with wife and call me back next week to schedule.

## 2023-05-26 ENCOUNTER — Ambulatory Visit (INDEPENDENT_AMBULATORY_CARE_PROVIDER_SITE_OTHER): Admitting: Physician Assistant

## 2023-05-26 ENCOUNTER — Encounter (INDEPENDENT_AMBULATORY_CARE_PROVIDER_SITE_OTHER): Payer: Self-pay | Admitting: Physician Assistant

## 2023-05-26 VITALS — BP 114/72 | HR 68 | Temp 98.0°F | Ht 70.5 in | Wt 262.0 lb

## 2023-05-26 DIAGNOSIS — I10 Essential (primary) hypertension: Secondary | ICD-10-CM

## 2023-05-26 DIAGNOSIS — M179 Osteoarthritis of knee, unspecified: Secondary | ICD-10-CM

## 2023-05-26 DIAGNOSIS — Z6837 Body mass index (BMI) 37.0-37.9, adult: Secondary | ICD-10-CM

## 2023-05-26 DIAGNOSIS — G4733 Obstructive sleep apnea (adult) (pediatric): Secondary | ICD-10-CM

## 2023-05-26 DIAGNOSIS — E785 Hyperlipidemia, unspecified: Secondary | ICD-10-CM | POA: Diagnosis not present

## 2023-05-26 DIAGNOSIS — E66812 Obesity, class 2: Secondary | ICD-10-CM

## 2023-05-26 NOTE — Progress Notes (Signed)
 Office: [phone]  /  Fax: [phone]   Initial Visit  Frank Clarke was seen in clinic today to evaluate for obesity. He is interested in losing weight to improve overall health and reduce the risk of weight related complications. He presents today to review program treatment options, initial physical assessment, and evaluation.     Discussed the use of AI scribe software for clinical note transcription with the patient, who gave verbal consent to proceed.  History of Present Illness Frank Clarke is a 43 year old male with obesity who presents for initial evaluation for obesity treatment.  He has experienced fluctuating weight throughout his life. Despite maintaining a daily calorie deficit of 1000 to 1500 calories and exercising three to four times a week, he has not achieved desired weight loss. His exercise routine includes walking and using an elliptical machine, aiming to burn 1000 calories per session. He typically consumes between 2000 to 2500 calories daily, although his target is 3399 calories as per his Fitbit app.   He has a history of sleep apnea and uses a CPAP machine. He experiences difficulty sleeping due to various orthopedic issues, making it challenging to find a comfortable sleeping position. He reports fatigue and low energy levels throughout the day.  He is currently on hydrochlorothiazide and has a history of hypertension and high cholesterol. He is scheduled for knee replacement surgery in two weeks. He aims to lose weight to improve his quality of life and reduce surgical risks.  He has a family history of obesity, particularly in his grandfather, who was overweight for much of his life before adopting healthier eating habits. No family history of heart disease.  He follows a low-carb diet at home... He has not used any medications for weight loss previously.   He is scheduled for Knee replacement surgery in 2 weeks and would like to delay starting the  program until he has recovered sufficiently to drive after surgery.   He was referred by: Friend or Family- Marlin Simmonds, PA-C for [facility] is the patient's wife.     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   When asked what else they would like to accomplish? He states: Adopt healthier eating patterns, Improve energy levels and physical activity, Improve existing medical conditions, Reduce risk for a surgery, Improve quality of life, Improve appearance, Improve self-confidence, and Lose a target amount of weight : 220 lbs in 6-9 months.  When asked how has your weight affected you? He states: Has affected self-esteem, Contributed to medical problems, Contributed to orthopedic problems or mobility issues, Having fatigue, Problems with eating patterns, and Has affected mood   Weight history: He has experienced fluctuating weight throughout his life. Despite maintaining a daily calorie deficit of 1000 to 1500 calories and exercising three to four times a week, he has not achieved desired weight loss. His exercise routine includes walking and using an elliptical machine, aiming to burn 1000 calories per session. He typically consumes between 2000 to 2500 calories daily, although his target is 3399 calories as per his Fitbit app.   Some associated conditions: Hypertension, Arthritis:Knees, Hyperlipidemia, and OSA  Contributing factors: Family history of obesity, Disruption of circadian rhythm / sleep disordered breathing, Consumption of processed foods, Moderate to high levels of stress, Eating patterns, and Slow metabolism for age  Weight promoting medications identified: None  Current nutrition plan: Low-carb and High-protein  Current level of physical activity: Other: Elliptical for 60 minutes 3-5 times weekly.  Exercise  is somewhat limited due to the need for knee replacement surgery at this time.  Current or previous pharmacotherapy: None  Response to medication: Never tried  medications   Past medical history includes:   Past Medical History:  Diagnosis Date   Dental crowns present    Irritable bowel    Perianal abscess 10/2016   Sinus congestion 10/29/2016     Objective:   BP 114/72   Pulse 68   Temp 98 F (36.7 C)   Ht 5' 10.5" (1.791 m)   Wt 262 lb (118.8 kg)   SpO2 98%   BMI 37.06 kg/m  He was weighed on the bioimpedance scale: Body mass index is 37.06 kg/m.  Peak Weight:275 lbs , Body Fat%:31.7%, Visceral Fat Rating:16, Weight trend over the last 12 months: Unchanged  General:  Alert, oriented and cooperative. Patient is in no acute distress.  Respiratory: Normal respiratory effort, no problems with respiration noted   Gait: able to ambulate independently  Mental Status: Normal mood and affect. Normal behavior. Normal judgment and thought content.   DIAGNOSTIC DATA REVIEWED:  BMET    Component Value Date/Time   NA 139 01/10/2016 1012   K 3.6 01/10/2016 1012   CL 102 01/10/2016 1012   CO2 27 01/10/2016 1012   GLUCOSE 98 01/10/2016 1012   BUN 6 01/10/2016 1012   CREATININE 0.98 01/10/2016 1012   CALCIUM 8.9 01/10/2016 1012   GFRNONAA >60 01/10/2016 1012   GFRAA >60 01/10/2016 1012   No results found for: "HGBA1C" No results found for: "INSULIN" CBC    Component Value Date/Time   WBC 3.8 (L) 01/10/2016 1012   RBC 5.24 01/10/2016 1012   HGB 15.2 01/10/2016 1012   HCT 43.5 01/10/2016 1012   PLT 216 01/10/2016 1012   MCV 83.0 01/10/2016 1012   MCH 29.0 01/10/2016 1012   MCHC 34.9 01/10/2016 1012   RDW 12.3 01/10/2016 1012   Iron/TIBC/Ferritin/ %Sat No results found for: "IRON", "TIBC", "FERRITIN", "IRONPCTSAT" Lipid Panel  No results found for: "CHOL", "TRIG", "HDL", "CHOLHDL", "VLDL", "LDLCALC", "LDLDIRECT" Hepatic Function Panel     Component Value Date/Time   PROT 7.9 01/10/2016 1012   ALBUMIN 4.6 01/10/2016 1012   AST 165 (H) 01/10/2016 1012   ALT 96 (H) 01/10/2016 1012   ALKPHOS 73 01/10/2016 1012   BILITOT  1.1 01/10/2016 1012   No results found for: "TSH"   Assessment and Plan:   Essential hypertension  Hyperlipidemia, unspecified hyperlipidemia type  Obstructive sleep apnea on CPAP  Osteoarthritis of knee, unspecified laterality, unspecified osteoarthritis type  Class 2 severe obesity due to excess calories with serious comorbidity and body mass index (BMI) of 37.0 to 37.9 in adult (HCC)  Current BMI 37.05  Assessment and Plan Assessment & Plan Obesity Chronic obesity with a BMI of 37. Weight loss has plateaued despite regular exercise and a calorie deficit. Suspected metabolic issues, including insulin resistance. He is motivated to achieve a target weight of 220 pounds. Current adipose mass is 83.4 pounds with a body adipose percentage of 31.7%. Visceral adipose rating is 16, with a target of 10 or less. Increased risk of colorectal and prostate cancer, heart disease, and type 2 diabetes due to current adipose levels. Interested in a comprehensive weight management program, including potential medication if clinically indicated. - Schedule metabolism test to assess caloric needs. - Increase protein intake to 150-160 grams per day. - Monitor insulin levels for insulin resistance. - Consider medication if insulin resistance is confirmed. -  Increase caloric intake to support healthy weight loss. - Continue current exercise regimen, adjusting post-surgery.   Knee osteoarthritis Knee osteoarthritis with a scheduled knee replacement surgery in two weeks. Weight loss will be beneficial to reduce surgical risk and enhance post-operative recovery. He is motivated to focus on weight management during the recovery period. - Proceed with scheduled knee replacement surgery. - Emphasize healthy weight loss to reduce surgical risk and improve recovery.  Hypertension Hypertension managed with hydrochlorothiazide. He aims to discontinue hydrochlorothiazide if weight loss is  achieved.  Hyperlipidemia Elevated LDL cholesterol levels. No family history of heart disease. Coronary calcium score is zero, indicating no current concerns for coronary artery disease. Motivated to improve lipid profile through weight management and dietary changes.  Sleep apnea Sleep apnea managed with CPAP therapy. Sleep quality affected by orthopedic issues, making comfortable sleep challenging.  General Health Maintenance Check Vitamin D levels to ensure are within the desired range of 50-70. Creatinine levels are slightly elevated but within normal range. TSH, glucose, and A1c levels are normal. Advised to maintain regular health screenings and monitor any changes.  Follow-up Follow-up planned post knee surgery to reassess weight management strategies and metabolic testing. He will schedule the appointment once able to drive post-surgery. - Schedule follow-up appointment post knee surgery to reassess weight management and metabolic testing.        Obesity Treatment / Action Plan:  Patient will work on garnering support from family and friends to begin weight loss journey. Will work on eliminating or reducing the presence of highly palatable, calorie dense foods in the home. Will complete provided nutritional and psychosocial assessment questionnaire before the next appointment. Will be scheduled for indirect calorimetry to determine resting energy expenditure in a fasting state.  This will allow us  to create a reduced calorie, high-protein meal plan to promote loss of fat mass while preserving muscle mass. Will avoid skipping meals which may result in increased hunger signals and overeating at certain times. Will work on managing stress via relaxation methods as this may result in unhealthy eating patterns. Counseled on the health benefits of losing 5%-15% of total body weight. Will work on improving sleep hygiene and trying to obtain at least 7 hours of sleep. Was counseled on  nutritional approaches to weight loss and benefits of reducing processed foods and consuming plant-based foods and high quality protein as part of nutritional weight management. Was counseled on pharmacotherapy and role as an adjunct in weight management.  Will work on increasing water intake with a goal of 125 ounces for men and 91 ounces for women.   Obesity Education Performed Today:  He was weighed on the bioimpedance scale and results were discussed and documented in the synopsis.  We discussed obesity as a disease and the importance of a more detailed evaluation of all the factors contributing to the disease.  We discussed the importance of long term lifestyle changes which include nutrition, exercise and behavioral modifications as well as the importance of customizing this to his specific health and social needs.  We discussed the benefits of reaching a healthier weight to alleviate the symptoms of existing conditions and reduce the risks of the biomechanical, metabolic and psychological effects of obesity.  KEATEN LIRA appears to be in the action stage of change and states they are ready to start intensive lifestyle modifications and behavioral modifications.  I have spent 30 minutes in the care of the patient today including: preparing to see patient (e.g. review and interpretation  of tests, old notes ), obtaining and/or reviewing separately obtained history, performing a medically appropriate examination or evaluation, counseling and educating the patient, documenting clinical information in the electronic or other health care record, and independently interpreting results and communicating results to the patient, family, or caregiver   Reviewed by clinician on day of visit: allergies, medications, problem list, medical history, surgical history, family history, social history, and previous encounter notes pertinent to obesity diagnosis.   Jamel Holzmann,PA-C

## 2023-06-08 NOTE — Progress Notes (Signed)
 Anesthesia Review:  PCP: [place] LVO 05/26/23  Cardiologist : Bernestine Brighter Groce,Np LOV 04/22/22   PPM/ ICD: Device Orders: Rep Notified:  Chest x-ray : EKG : CT card- 03/24/23- [facility]  Echo : Stress test: Cardiac Cath :   Activity level:  Sleep Study/ CPAP : Fasting Blood Sugar :      / Checks Blood Sugar -- times a day:    Blood Thinner/ Instructions /Last Dose: ASA / Instructions/ Last Dose :

## 2023-06-09 NOTE — Patient Instructions (Signed)
 SURGICAL WAITING ROOM VISITATION  Patients having surgery or a procedure may have no more than 2 support people in the waiting area - these visitors may rotate.    Children under the age of 40 must have an adult with them who is not the patient.  Due to an increase in RSV and influenza rates and associated hospitalizations, children ages 54 and under may not visit patients in [facility] hospitals.  Visitors with respiratory illnesses are discouraged from visiting and should remain at home.  If the patient needs to stay at the hospital during part of their recovery, the visitor guidelines for inpatient rooms apply. Pre-op nurse will coordinate an appropriate time for 1 support person to accompany patient in pre-op.  This support person may not rotate.    Please refer to the [facility] website for the visitor guidelines for Inpatients (after your surgery is over and you are in a regular room).       Your procedure is scheduled on:  06/18/2023    Report to [facility] Main Entrance    Report to admitting at    0600AM   Call this number if you have problems the morning of surgery [phone]   Do not eat food :After Midnight.   After Midnight you may have the following liquids until _ 0530_____ AM  DAY OF SURGERY  Water Non-Citrus Juices (without pulp, NO RED-Apple, White grape, White cranberry) Black Coffee (NO MILK/CREAM OR CREAMERS, sugar ok)  Clear Tea (NO MILK/CREAM OR CREAMERS, sugar ok) regular and decaf                             Plain Jell-O (NO RED)                                           Fruit ices (not with fruit pulp, NO RED)                                     Popsicles (NO RED)                                                               Sports drinks like Gatorade (NO RED)                     The day of surgery:  Drink ONE (1) Pre-Surgery Clear Ensure or G2 at 0530 AM ( have completed by )  the morning of surgery. Drink in one sitting. Do not sip.   This drink was given to you during your hospital  pre-op appointment visit. Nothing else to drink after completing the  Pre-Surgery Clear Ensure or G2.          If you have questions, please contact your surgeon's office.        Oral Hygiene is also important to reduce your risk of infection.  Remember - BRUSH YOUR TEETH THE MORNING OF SURGERY WITH YOUR REGULAR TOOTHPASTE  DENTURES WILL BE REMOVED PRIOR TO SURGERY PLEASE DO NOT APPLY "Poly grip" OR ADHESIVES!!!   Do NOT smoke after Midnight   Stop all vitamins and herbal supplements 7 days before surgery.   Take these medicines the morning of surgery with A SIP OF WATER:  none   DO NOT TAKE ANY ORAL DIABETIC MEDICATIONS DAY OF YOUR SURGERY  Bring CPAP mask and tubing day of surgery.                              You may not have any metal on your body including hair pins, jewelry, and body piercing             Do not wear make-up, lotions, powders, perfumes/cologne, or deodorant  Do not wear nail polish including gel and S&S, artificial/acrylic nails, or any other type of covering on natural nails including finger and toenails. If you have artificial nails, gel coating, etc. that needs to be removed by a nail salon please have this removed prior to surgery or surgery may need to be canceled/ delayed if the surgeon/ anesthesia feels like they are unable to be safely monitored.   Do not shave  48 hours prior to surgery.               Men may shave face and neck.   Do not bring valuables to the hospital. [facility] IS NOT             RESPONSIBLE   FOR VALUABLES.   Contacts, glasses, dentures or bridgework may not be worn into surgery.   Bring small overnight bag day of surgery.   DO NOT BRING YOUR HOME MEDICATIONS TO THE HOSPITAL. PHARMACY WILL DISPENSE MEDICATIONS LISTED ON YOUR MEDICATION LIST TO YOU DURING YOUR ADMISSION IN THE HOSPITAL!    Patients discharged on the day of surgery  will not be allowed to drive home.  Someone NEEDS to stay with you for the first 24 hours after anesthesia.   Special Instructions: Bring a copy of your healthcare power of attorney and living will documents the day of surgery if you haven't scanned them before.              Please read over the following fact sheets you were given: IF YOU HAVE QUESTIONS ABOUT YOUR PRE-OP INSTRUCTIONS PLEASE CALL [phone]   If you received a COVID test during your pre-op visit  it is requested that you wear a mask when out in public, stay away from anyone that may not be feeling well and notify your surgeon if you develop symptoms. If you test positive for Covid or have been in contact with anyone that has tested positive in the last 10 days please notify you surgeon.      Pre-operative 5 CHG Bath Instructions   You can play a key role in reducing the risk of infection after surgery. Your skin needs to be as free of germs as possible. You can reduce the number of germs on your skin by washing with CHG (chlorhexidine  gluconate) soap before surgery. CHG is an antiseptic soap that kills germs and continues to kill germs even after washing.   DO NOT use if you have an allergy to chlorhexidine /CHG or antibacterial soaps. If your skin becomes reddened or irritated, stop using the CHG and notify one of our RNs at  [phone].   Please shower with the CHG soap starting 4 days before surgery using the following schedule:     Please keep in mind the following:  DO NOT shave, including legs and underarms, starting the day of your first shower.   You may shave your face at any point before/day of surgery.  Place clean sheets on your bed the day you start using CHG soap. Use a clean washcloth (not used since being washed) for each shower. DO NOT sleep with pets once you start using the CHG.   CHG Shower Instructions:  If you choose to wash your hair and private area, wash first with your normal shampoo/soap.   After you use shampoo/soap, rinse your hair and body thoroughly to remove shampoo/soap residue.  Turn the water OFF and apply about 3 tablespoons (45 ml) of CHG soap to a CLEAN washcloth.  Apply CHG soap ONLY FROM YOUR NECK DOWN TO YOUR TOES (washing for 3-5 minutes)  DO NOT use CHG soap on face, private areas, open wounds, or sores.  Pay special attention to the area where your surgery is being performed.  If you are having back surgery, having someone wash your back for you may be helpful. Wait 2 minutes after CHG soap is applied, then you may rinse off the CHG soap.  Pat dry with a clean towel  Put on clean clothes/pajamas   If you choose to wear lotion, please use ONLY the CHG-compatible lotions on the back of this paper.     Additional instructions for the day of surgery: DO NOT APPLY any lotions, deodorants, cologne, or perfumes.   Put on clean/comfortable clothes.  Brush your teeth.  Ask your nurse before applying any prescription medications to the skin.      CHG Compatible Lotions   Aveeno Moisturizing lotion  Cetaphil Moisturizing Cream  Cetaphil Moisturizing Lotion  Clairol Herbal Essence Moisturizing Lotion, Dry Skin  Clairol Herbal Essence Moisturizing Lotion, Extra Dry Skin  Clairol Herbal Essence Moisturizing Lotion, Normal Skin  Curel Age Defying Therapeutic Moisturizing Lotion with Alpha Hydroxy  Curel Extreme Care Body Lotion  Curel Soothing Hands Moisturizing Hand Lotion  Curel Therapeutic Moisturizing Cream, Fragrance-Free  Curel Therapeutic Moisturizing Lotion, Fragrance-Free  Curel Therapeutic Moisturizing Lotion, Original Formula  Eucerin Daily Replenishing Lotion  Eucerin Dry Skin Therapy Plus Alpha Hydroxy Crme  Eucerin Dry Skin Therapy Plus Alpha Hydroxy Lotion  Eucerin Original Crme  Eucerin Original Lotion  Eucerin Plus Crme Eucerin Plus Lotion  Eucerin TriLipid Replenishing Lotion  Keri Anti-Bacterial Hand Lotion  Keri Deep Conditioning  Original Lotion Dry Skin Formula Softly Scented  Keri Deep Conditioning Original Lotion, Fragrance Free Sensitive Skin Formula  Keri Lotion Fast Absorbing Fragrance Free Sensitive Skin Formula  Keri Lotion Fast Absorbing Softly Scented Dry Skin Formula  Keri Original Lotion  Keri Skin Renewal Lotion Keri Silky Smooth Lotion  Keri Silky Smooth Sensitive Skin Lotion  Nivea Body Creamy Conditioning Oil  Nivea Body Extra Enriched Teacher, adult education Moisturizing Lotion Nivea Crme  Nivea Skin Firming Lotion  NutraDerm 30 Skin Lotion  NutraDerm Skin Lotion  NutraDerm Therapeutic Skin Cream  NutraDerm Therapeutic Skin Lotion  ProShield Protective Hand Cream  Provon moisturizing lotion

## 2023-06-15 ENCOUNTER — Encounter (HOSPITAL_COMMUNITY): Payer: Self-pay

## 2023-06-15 ENCOUNTER — Encounter (HOSPITAL_COMMUNITY)
Admission: RE | Admit: 2023-06-15 | Discharge: 2023-06-15 | Disposition: A | Discharge status: Home / Self Care | Source: Ambulatory Visit | Attending: Orthopaedic Surgery | Admitting: Orthopaedic Surgery

## 2023-06-15 VITALS — BP 127/83 | HR 69 | Temp 98.6°F | Resp 16 | Ht 71.0 in | Wt 243.0 lb

## 2023-06-15 DIAGNOSIS — M1711 Unilateral primary osteoarthritis, right knee: Secondary | ICD-10-CM | POA: Insufficient documentation

## 2023-06-15 DIAGNOSIS — Z01810 Encounter for preprocedural cardiovascular examination: Secondary | ICD-10-CM | POA: Diagnosis present

## 2023-06-15 DIAGNOSIS — Z01818 Encounter for other preprocedural examination: Secondary | ICD-10-CM | POA: Diagnosis not present

## 2023-06-15 DIAGNOSIS — Z01812 Encounter for preprocedural laboratory examination: Secondary | ICD-10-CM | POA: Diagnosis present

## 2023-06-15 HISTORY — DX: Essential (primary) hypertension: I10

## 2023-06-15 HISTORY — DX: Cardiac murmur, unspecified: R01.1

## 2023-06-15 HISTORY — DX: Unspecified osteoarthritis, unspecified site: M19.90

## 2023-06-15 HISTORY — DX: Sleep apnea, unspecified: G47.30

## 2023-06-15 LAB — BASIC METABOLIC PANEL WITH GFR
Anion gap: 9 (ref 5–15)
BUN: 21 mg/dL — ABNORMAL HIGH (ref 6–20)
CO2: 28 mmol/L (ref 22–32)
Calcium: 9.4 mg/dL (ref 8.9–10.3)
Chloride: 100 mmol/L (ref 98–111)
Creatinine, Ser: 1.16 mg/dL (ref 0.61–1.24)
GFR, Estimated: >60 mL/min (ref >60)
Glucose, Bld: 91 mg/dL (ref 70–99)
Potassium: 3.9 mmol/L (ref 3.5–5.1)
Sodium: 137 mmol/L (ref 135–145)

## 2023-06-15 LAB — CBC
HCT: 45 % (ref 39.0–52.0)
Hemoglobin: 15.5 g/dL (ref 13.0–17.0)
MCH: 29.5 pg (ref 26.0–34.0)
MCHC: 34.4 g/dL (ref 30.0–36.0)
MCV: 85.7 fL (ref 80.0–100.0)
Platelets: 229 10*3/uL (ref 150–400)
RBC: 5.25 MIL/uL (ref 4.22–5.81)
RDW: 12.1 % (ref 11.5–15.5)
WBC: 7.1 10*3/uL (ref 4.0–10.5)
nRBC: 0 % (ref 0.0–0.2)

## 2023-06-15 LAB — SURGICAL PCR SCREEN
MRSA, PCR: NEGATIVE
Staphylococcus aureus: NEGATIVE

## 2023-06-17 ENCOUNTER — Other Ambulatory Visit: Payer: Self-pay | Admitting: Orthopaedic Surgery

## 2023-06-17 DIAGNOSIS — M1711 Unilateral primary osteoarthritis, right knee: Secondary | ICD-10-CM | POA: Insufficient documentation

## 2023-06-17 MED ORDER — OXYCODONE HCL 5 MG PO TABS: 5.0000 mg | ORAL_TABLET | ORAL | 0 refills | Status: DC | PRN

## 2023-06-17 MED ORDER — TIZANIDINE HCL 4 MG PO TABS: 4.0000 mg | ORAL_TABLET | Freq: Four times a day (QID) | ORAL | 1 refills | Status: AC | PRN

## 2023-06-17 MED ORDER — KETOROLAC TROMETHAMINE 10 MG PO TABS: 10.0000 mg | ORAL_TABLET | Freq: Four times a day (QID) | ORAL | 0 refills | Status: DC | PRN

## 2023-06-17 NOTE — Anesthesia Preprocedure Evaluation (Addendum)
 Anesthesia Evaluation  Patient identified by MRN, date of birth, ID band Patient awake    Reviewed: Allergy & Precautions, NPO status , Patient's Chart, lab work & pertinent test results  History of Anesthesia Complications Negative for: history of anesthetic complications  Airway Mallampati: II  TM Distance: >3 FB Neck ROM: Full    Dental  (+) Dental Advisory Given   Pulmonary asthma , sleep apnea and Continuous Positive Airway Pressure Ventilation , COPD,  COPD inhaler   breath sounds clear to auscultation       Cardiovascular hypertension, Pt. on medications (-) angina  Rhythm:Regular Rate:Normal  H/o ASD repair age 43yo: no sequelae   Neuro/Psych negative neurological ROS     GI/Hepatic negative GI ROS, Neg liver ROS,,,  Endo/Other  BMI 34  Renal/GU negative Renal ROS     Musculoskeletal  (+) Arthritis ,    Abdominal   Peds  Hematology Hb 15.5, plt 229k   Anesthesia Other Findings   Reproductive/Obstetrics                             Anesthesia Physical Anesthesia Plan  ASA: 3  Anesthesia Plan: Spinal   Post-op Pain Management: Regional block* and Tylenol  PO (pre-op)*   Induction:   PONV Risk Score and Plan: 1 and Treatment may vary due to age or medical condition  Airway Management Planned: Natural Airway and Simple Face Mask  Additional Equipment: None  Intra-op Plan:   Post-operative Plan:   Informed Consent: I have reviewed the patients History and Physical, chart, labs and discussed the procedure including the risks, benefits and alternatives for the proposed anesthesia with the patient or authorized representative who has indicated his/her understanding and acceptance.     Dental advisory given  Plan Discussed with: CRNA and Surgeon  Anesthesia Plan Comments: (Plan routine monitors, SAB with adductor canal block for post op analgesia)        Anesthesia  Quick Evaluation

## 2023-06-17 NOTE — H&P (Signed)
 TOTAL KNEE ADMISSION H&P  Patient is being admitted for right total knee arthroplasty.  Subjective:  Chief Complaint:right knee pain.  HPI: Frank Clarke, 43 y.o. male, has a history of pain and functional disability in the right knee due to arthritis and has failed non-surgical conservative treatments for greater than 12 weeks to includeNSAID's and/or analgesics, corticosteriod injections, viscosupplementation injections, flexibility and strengthening excercises, and activity modification.  Onset of symptoms was gradual, starting several years ago with gradually worsening course since that time. The patient noted prior procedures on the knee to include  arthroscopy and menisectomy on the right knee(s).  Patient currently rates pain in the right knee(s) at 10 out of 10 with activity. Patient has night pain, worsening of pain with activity and weight bearing, pain that interferes with activities of daily living, pain with passive range of motion, crepitus, and joint swelling.  Patient has evidence of subchondral sclerosis, periarticular osteophytes, and joint space narrowing by imaging studies. There is no active infection.  Patient Active Problem List   Diagnosis Date Noted   Unilateral primary osteoarthritis, right knee 06/17/2023   Past Medical History:  Diagnosis Date   Arthritis    Dental crowns present    Heart murmur    Hypertension    Irritable bowel    Perianal abscess 10/2016   Sinus congestion 10/29/2016   Sleep apnea    cpap    Past Surgical History:  Procedure Laterality Date   ASD REPAIR     age 41   CHOLECYSTECTOMY     EVALUATION UNDER ANESTHESIA WITH FISTULECTOMY Left 11/04/2016   Procedure: EXAM UNDER ANESTHESIA WITH FISTULOTOMY;  Surgeon: Oza Blumenthal, MD;  Location: [facility];  Service: General;  Laterality: Left;   KNEE ARTHROSCOPY Right    x 2   KNEE ARTHROSCOPY Left    SHOULDER ARTHROSCOPY Right     No current facility-administered  medications for this encounter.   Current Outpatient Medications  Medication Sig Dispense Refill Last Dose/Taking   acetaminophen  (TYLENOL ) 500 MG tablet Take 1,000 mg by mouth at bedtime.   Taking   celecoxib (CELEBREX) 200 MG capsule Take 200 mg by mouth at bedtime.   Taking   Cholecalciferol (VITAMIN D3 PO) Take 1 tablet by mouth every evening.   Taking   EPINEPHrine  0.3 mg/0.3 mL IJ SOAJ injection Inject 0.3 mg into the muscle as needed for anaphylaxis.   Taking As Needed   hydrochlorothiazide (HYDRODIURIL) 25 MG tablet Take 25 mg by mouth at bedtime.   Taking   levocetirizine (XYZAL) 5 MG tablet Take 5 mg by mouth at bedtime.   Taking   montelukast (SINGULAIR) 10 MG tablet Take 10 mg by mouth at bedtime.   Taking   NON FORMULARY Inject 1 Dose as directed once a week. Allergy Shots   Taking   ketorolac  (TORADOL ) 10 MG tablet Take 1 tablet (10 mg total) by mouth every 6 (six) hours as needed. 20 tablet 0    oxyCODONE  (OXY IR/ROXICODONE ) 5 MG immediate release tablet Take 1-2 tablets (5-10 mg total) by mouth every 4 (four) hours as needed for severe pain (pain score 7-10). 30 tablet 0    tiZANidine (ZANAFLEX) 4 MG tablet Take 1 tablet (4 mg total) by mouth every 6 (six) hours as needed for muscle spasms. 30 tablet 1    No Known Allergies  Social History   Tobacco Use   Smoking status: Never   Smokeless tobacco: Never  Substance Use Topics  Alcohol use: Yes    Comment: 2 drinks/week    No family history on file.   Review of Systems  Objective:  Physical Exam Vitals reviewed.  Constitutional:      Appearance: Normal appearance. He is normal weight.  HENT:     Head: Normocephalic and atraumatic.  Eyes:     Extraocular Movements: Extraocular movements intact.     Pupils: Pupils are equal, round, and reactive to light.  Cardiovascular:     Rate and Rhythm: Normal rate and regular rhythm.     Pulses: Normal pulses.  Pulmonary:     Effort: Pulmonary effort is normal.      Breath sounds: Normal breath sounds.  Abdominal:     Palpations: Abdomen is soft.  Musculoskeletal:     Cervical back: Normal range of motion and neck supple.     Right knee: Effusion, bony tenderness and crepitus present. Decreased range of motion. Tenderness present over the medial joint line and lateral joint line. Abnormal meniscus.  Neurological:     Mental Status: He is alert and oriented to person, place, and time.  Psychiatric:        Behavior: Behavior normal.     Vital signs in last 24 hours:    Labs:   Estimated body mass index is 33.89 kg/m as calculated from the following:   Height as of 06/15/23: 5\' 11"  (1.803 m).   Weight as of 06/15/23: 110.2 kg.   Imaging Review Plain radiographs demonstrate severe degenerative joint disease of the right knee(s). The overall alignment isneutral. The bone quality appears to be excellent for age and reported activity level.      Assessment/Plan:  End stage arthritis, right knee   The patient history, physical examination, clinical judgment of the provider and imaging studies are consistent with end stage degenerative joint disease of the right knee(s) and total knee arthroplasty is deemed medically necessary. The treatment options including medical management, injection therapy arthroscopy and arthroplasty were discussed at length. The risks and benefits of total knee arthroplasty were presented and reviewed. The risks due to aseptic loosening, infection, stiffness, patella tracking problems, thromboembolic complications and other imponderables were discussed. The patient acknowledged the explanation, agreed to proceed with the plan and consent was signed. Patient is being admitted for inpatient treatment for surgery, pain control, PT, OT, prophylactic antibiotics, VTE prophylaxis, progressive ambulation and ADL's and discharge planning. The patient is planning to be discharged home with home health services

## 2023-06-18 ENCOUNTER — Ambulatory Visit (HOSPITAL_COMMUNITY): Admitting: Anesthesiology

## 2023-06-18 ENCOUNTER — Ambulatory Visit (HOSPITAL_BASED_OUTPATIENT_CLINIC_OR_DEPARTMENT_OTHER): Admitting: Anesthesiology

## 2023-06-18 ENCOUNTER — Other Ambulatory Visit: Payer: Self-pay

## 2023-06-18 ENCOUNTER — Encounter (HOSPITAL_COMMUNITY): Payer: Self-pay | Admitting: Orthopaedic Surgery

## 2023-06-18 ENCOUNTER — Ambulatory Visit (HOSPITAL_COMMUNITY)
Admission: RE | Admit: 2023-06-18 | Discharge: 2023-06-18 | Disposition: A | Discharge status: Home / Self Care | Attending: Orthopaedic Surgery | Admitting: Orthopaedic Surgery

## 2023-06-18 ENCOUNTER — Ambulatory Visit (HOSPITAL_COMMUNITY)

## 2023-06-18 ENCOUNTER — Encounter (HOSPITAL_COMMUNITY)
Admission: RE | Disposition: A | Discharge status: Home / Self Care | Payer: Self-pay | Source: Home / Self Care | Attending: Orthopaedic Surgery

## 2023-06-18 DIAGNOSIS — I10 Essential (primary) hypertension: Secondary | ICD-10-CM | POA: Insufficient documentation

## 2023-06-18 DIAGNOSIS — G4733 Obstructive sleep apnea (adult) (pediatric): Secondary | ICD-10-CM

## 2023-06-18 DIAGNOSIS — G4730 Sleep apnea, unspecified: Secondary | ICD-10-CM | POA: Diagnosis not present

## 2023-06-18 DIAGNOSIS — M1711 Unilateral primary osteoarthritis, right knee: Secondary | ICD-10-CM

## 2023-06-18 DIAGNOSIS — Z79899 Other long term (current) drug therapy: Secondary | ICD-10-CM | POA: Diagnosis not present

## 2023-06-18 DIAGNOSIS — Z01818 Encounter for other preprocedural examination: Secondary | ICD-10-CM

## 2023-06-18 DIAGNOSIS — J449 Chronic obstructive pulmonary disease, unspecified: Secondary | ICD-10-CM

## 2023-06-18 HISTORY — PX: TOTAL KNEE ARTHROPLASTY: SHX125

## 2023-06-18 SURGERY — ARTHROPLASTY, KNEE, TOTAL
Anesthesia: Spinal | Site: Knee | Laterality: Right

## 2023-06-18 MED ORDER — BUPIVACAINE IN DEXTROSE 0.75-8.25 % IT SOLN
INTRATHECAL | Status: DC | PRN
  Administered 2023-06-18: 13.5 mg via INTRATHECAL

## 2023-06-18 MED ORDER — BUPIVACAINE-EPINEPHRINE (PF) 0.25% -1:200000 IJ SOLN
INTRAMUSCULAR | Status: AC
  Filled 2023-06-18: qty 30

## 2023-06-18 MED ORDER — MIDAZOLAM HCL 2 MG/2ML IJ SOLN
INTRAMUSCULAR | Status: DC | PRN
  Administered 2023-06-18: 2 mg via INTRAVENOUS

## 2023-06-18 MED ORDER — CHLORHEXIDINE GLUCONATE 0.12 % MT SOLN
15.0000 mL | Freq: Once | OROMUCOSAL | Status: AC
  Administered 2023-06-18: 15 mL via OROMUCOSAL

## 2023-06-18 MED ORDER — PHENYLEPHRINE HCL (PRESSORS) 10 MG/ML IV SOLN
INTRAVENOUS | Status: AC
Start: 2023-06-18 — End: ?
  Filled 2023-06-18: qty 1

## 2023-06-18 MED ORDER — LACTATED RINGERS IV BOLUS
500.0000 mL | Freq: Once | INTRAVENOUS | Status: AC
  Administered 2023-06-18: 500 mL via INTRAVENOUS

## 2023-06-18 MED ORDER — PROPOFOL 1000 MG/100ML IV EMUL
INTRAVENOUS | Status: AC
  Filled 2023-06-18: qty 100

## 2023-06-18 MED ORDER — BUPIVACAINE-EPINEPHRINE 0.25% -1:200000 IJ SOLN
INTRAMUSCULAR | Status: DC | PRN
Start: 2023-06-18 — End: 2023-06-18
  Administered 2023-06-18: 30 mL

## 2023-06-18 MED ORDER — TRANEXAMIC ACID-NACL 1000-0.7 MG/100ML-% IV SOLN
1000.0000 mg | INTRAVENOUS | Status: DC
  Filled 2023-06-18: qty 100

## 2023-06-18 MED ORDER — ONDANSETRON HCL 4 MG/2ML IJ SOLN
INTRAMUSCULAR | Status: AC
  Filled 2023-06-18: qty 2

## 2023-06-18 MED ORDER — SODIUM CHLORIDE 0.9 % IR SOLN
Status: DC | PRN
  Administered 2023-06-18: 1000 mL

## 2023-06-18 MED ORDER — GLYCOPYRROLATE 0.2 MG/ML IJ SOLN
INTRAMUSCULAR | Status: AC
  Filled 2023-06-18: qty 1

## 2023-06-18 MED ORDER — LIDOCAINE HCL (CARDIAC) PF 100 MG/5ML IV SOSY
PREFILLED_SYRINGE | INTRAVENOUS | Status: DC | PRN
  Administered 2023-06-18: 50 mg via INTRATRACHEAL

## 2023-06-18 MED ORDER — ORAL CARE MOUTH RINSE: 15.0000 mL | Freq: Once | OROMUCOSAL | Status: AC

## 2023-06-18 MED ORDER — DEXAMETHASONE SODIUM PHOSPHATE 10 MG/ML IJ SOLN
INTRAMUSCULAR | Status: DC | PRN
Start: 2023-06-18 — End: 2023-06-18
  Administered 2023-06-18: 8 mg via INTRAVENOUS

## 2023-06-18 MED ORDER — OXYCODONE HCL 5 MG/5ML PO SOLN: 5.0000 mg | Freq: Once | ORAL | Status: AC | PRN

## 2023-06-18 MED ORDER — HYDROMORPHONE HCL 1 MG/ML IJ SOLN
INTRAMUSCULAR | Status: AC
  Filled 2023-06-18: qty 1

## 2023-06-18 MED ORDER — CEFAZOLIN SODIUM-DEXTROSE 2-4 GM/100ML-% IV SOLN
2.0000 g | INTRAVENOUS | Status: AC
Start: 2023-06-18 — End: 2023-06-18
  Administered 2023-06-18: 2 g via INTRAVENOUS
  Filled 2023-06-18: qty 100

## 2023-06-18 MED ORDER — LIDOCAINE HCL (PF) 2 % IJ SOLN
INTRAMUSCULAR | Status: AC
  Filled 2023-06-18: qty 5

## 2023-06-18 MED ORDER — MIDAZOLAM HCL 2 MG/2ML IJ SOLN
INTRAMUSCULAR | Status: AC
  Filled 2023-06-18: qty 2

## 2023-06-18 MED ORDER — ROPIVACAINE HCL 7.5 MG/ML IJ SOLN
INTRAMUSCULAR | Status: DC | PRN
  Administered 2023-06-18: 20 mL via PERINEURAL

## 2023-06-18 MED ORDER — PHENYLEPHRINE HCL (PRESSORS) 10 MG/ML IV SOLN: INTRAVENOUS | Status: DC | PRN

## 2023-06-18 MED ORDER — HYDROMORPHONE HCL 1 MG/ML IJ SOLN
0.2500 mg | INTRAMUSCULAR | Status: DC | PRN
  Administered 2023-06-18 (×4): 0.5 mg via INTRAVENOUS

## 2023-06-18 MED ORDER — OXYCODONE HCL 5 MG PO TABS
ORAL_TABLET | ORAL | Status: AC
  Filled 2023-06-18: qty 1

## 2023-06-18 MED ORDER — PROPOFOL 10 MG/ML IV BOLUS
INTRAVENOUS | Status: DC | PRN
Start: 2023-06-18 — End: 2023-06-18
  Administered 2023-06-18: 110 ug/kg/min via INTRAVENOUS
  Administered 2023-06-18: 100 mg via INTRAVENOUS

## 2023-06-18 MED ORDER — MEPERIDINE HCL 50 MG/ML IJ SOLN: 6.2500 mg | INTRAMUSCULAR | Status: DC | PRN

## 2023-06-18 MED ORDER — DEXAMETHASONE SODIUM PHOSPHATE 10 MG/ML IJ SOLN
INTRAMUSCULAR | Status: AC
Start: 2023-06-18 — End: ?
  Filled 2023-06-18: qty 1

## 2023-06-18 MED ORDER — GLYCOPYRROLATE 0.2 MG/ML IJ SOLN
INTRAMUSCULAR | Status: DC | PRN
  Administered 2023-06-18: .2 mg via INTRAVENOUS

## 2023-06-18 MED ORDER — ACETAMINOPHEN 500 MG PO TABS
1000.0000 mg | ORAL_TABLET | Freq: Once | ORAL | Status: AC
Start: 2023-06-18 — End: 2023-06-18
  Administered 2023-06-18: 1000 mg via ORAL
  Filled 2023-06-18: qty 2

## 2023-06-18 MED ORDER — TRANEXAMIC ACID-NACL 1000-0.7 MG/100ML-% IV SOLN
INTRAVENOUS | Status: DC | PRN
  Administered 2023-06-18: 1000 mg via INTRAVENOUS

## 2023-06-18 MED ORDER — OXYCODONE HCL 5 MG PO TABS
5.0000 mg | ORAL_TABLET | Freq: Once | ORAL | Status: AC | PRN
  Administered 2023-06-18: 5 mg via ORAL

## 2023-06-18 MED ORDER — LACTATED RINGERS IV SOLN: INTRAVENOUS | Status: DC

## 2023-06-18 MED ORDER — LACTATED RINGERS IV SOLN: INTRAVENOUS | Status: DC | PRN

## 2023-06-18 MED ORDER — MIDAZOLAM HCL 2 MG/2ML IJ SOLN: 0.5000 mg | Freq: Once | INTRAMUSCULAR | Status: DC | PRN

## 2023-06-18 MED ORDER — MIDAZOLAM HCL 2 MG/2ML IJ SOLN
2.0000 mg | Freq: Once | INTRAMUSCULAR | Status: AC
  Administered 2023-06-18: 2 mg via INTRAVENOUS
  Filled 2023-06-18: qty 2

## 2023-06-18 MED ORDER — STERILE WATER FOR IRRIGATION IR SOLN
Status: DC | PRN
  Administered 2023-06-18: 1000 mL

## 2023-06-18 MED ORDER — ONDANSETRON HCL 4 MG/2ML IJ SOLN
INTRAMUSCULAR | Status: DC | PRN
  Administered 2023-06-18: 4 mg via INTRAVENOUS

## 2023-06-18 MED ORDER — FENTANYL CITRATE PF 50 MCG/ML IJ SOSY
100.0000 ug | PREFILLED_SYRINGE | Freq: Once | INTRAMUSCULAR | Status: AC
  Administered 2023-06-18: 100 ug via INTRAVENOUS
  Filled 2023-06-18: qty 2

## 2023-06-18 MED ORDER — LACTATED RINGERS IV BOLUS
250.0000 mL | Freq: Once | INTRAVENOUS | Status: AC
  Administered 2023-06-18: 250 mL via INTRAVENOUS

## 2023-06-18 MED ORDER — HYDROMORPHONE HCL 1 MG/ML IJ SOLN: 0.5000 mg | INTRAMUSCULAR | Status: DC | PRN

## 2023-06-18 MED ORDER — PHENYLEPHRINE 80 MCG/ML (10ML) SYRINGE FOR IV PUSH (FOR BLOOD PRESSURE SUPPORT)
PREFILLED_SYRINGE | INTRAVENOUS | Status: AC
Start: 2023-06-18 — End: ?
  Filled 2023-06-18: qty 10

## 2023-06-18 MED ORDER — 0.9 % SODIUM CHLORIDE (POUR BTL) OPTIME
TOPICAL | Status: DC | PRN
  Administered 2023-06-18: 1000 mL

## 2023-06-18 SURGICAL SUPPLY — 54 items
BAG COUNTER SPONGE SURGICOUNT (BAG) IMPLANT
BAG ZIPLOCK 12X15 (MISCELLANEOUS) ×1 IMPLANT
BENZOIN TINCTURE PRP APPL 2/3 (GAUZE/BANDAGES/DRESSINGS) IMPLANT
BLADE SAG 18X100X1.27 (BLADE) ×1 IMPLANT
BLADE SURG SZ10 CARB STEEL (BLADE) IMPLANT
BNDG ELASTIC 6INX 5YD STR LF (GAUZE/BANDAGES/DRESSINGS) ×2 IMPLANT
BOWL SMART MIX CTS (DISPOSABLE) IMPLANT
COMPONENT PATELLA PEG 3 32 (Joint) IMPLANT
COOLER ICEMAN CLASSIC (MISCELLANEOUS) ×1 IMPLANT
COVER SURGICAL LIGHT HANDLE (MISCELLANEOUS) ×1 IMPLANT
CUFF TRNQT CYL 34X4.125X (TOURNIQUET CUFF) ×1 IMPLANT
DRAPE INCISE IOBAN 66X45 STRL (DRAPES) ×1 IMPLANT
DRAPE U-SHAPE 47X51 STRL (DRAPES) ×1 IMPLANT
DRSG AQUACEL AG ADV 3.5X10 (GAUZE/BANDAGES/DRESSINGS) IMPLANT
DURAPREP 26ML APPLICATOR (WOUND CARE) ×1 IMPLANT
ELECT BLADE TIP CTD 4 INCH (ELECTRODE) ×1 IMPLANT
ELECT PENCIL ROCKER SW 15FT (MISCELLANEOUS) ×1 IMPLANT
ELECT REM PT RETURN 15FT ADLT (MISCELLANEOUS) ×1 IMPLANT
GAUZE PAD ABD 8X10 STRL (GAUZE/BANDAGES/DRESSINGS) ×2 IMPLANT
GAUZE SPONGE 4X4 12PLY STRL (GAUZE/BANDAGES/DRESSINGS) ×1 IMPLANT
GAUZE XEROFORM 1X8 LF (GAUZE/BANDAGES/DRESSINGS) IMPLANT
GLOVE BIO SURGEON STRL SZ7.5 (GLOVE) ×1 IMPLANT
GLOVE BIOGEL PI IND STRL 8 (GLOVE) ×2 IMPLANT
GLOVE ECLIPSE 8.0 STRL XLNG CF (GLOVE) ×1 IMPLANT
GOWN STRL REUS W/ TWL XL LVL3 (GOWN DISPOSABLE) ×2 IMPLANT
HOLDER FOLEY CATH W/STRAP (MISCELLANEOUS) IMPLANT
IMMOBILIZER KNEE 20 (SOFTGOODS) ×1 IMPLANT
IMMOBILIZER KNEE 20 THIGH 36 (SOFTGOODS) ×1 IMPLANT
INSERT TIB ARTISURF SZ8-11X12 (Insert) IMPLANT
KIT TURNOVER KIT A (KITS) IMPLANT
MANIFOLD NEPTUNE II (INSTRUMENTS) ×1 IMPLANT
NS IRRIG 1000ML POUR BTL (IV SOLUTION) ×1 IMPLANT
PACK TOTAL KNEE CUSTOM (KITS) ×1 IMPLANT
PAD COLD SHLDR WRAP-ON (PAD) ×1 IMPLANT
PADDING CAST COTTON 6X4 STRL (CAST SUPPLIES) ×2 IMPLANT
PIN DRILL HDLS TROCAR 75 4PK (PIN) IMPLANT
PROSTHESIS FEM KNEE PS STD9 RT (Joint) IMPLANT
PROSTHESIS TIB KNEE PS 0D F RT (Joint) IMPLANT
PROTECTOR NERVE ULNAR (MISCELLANEOUS) ×1 IMPLANT
SCREW FEMALE HEX FIX 25X2.5 (ORTHOPEDIC DISPOSABLE SUPPLIES) IMPLANT
SET HNDPC FAN SPRY TIP SCT (DISPOSABLE) ×1 IMPLANT
SET PAD KNEE POSITIONER (MISCELLANEOUS) ×1 IMPLANT
SPIKE FLUID TRANSFER (MISCELLANEOUS) IMPLANT
STAPLER SKIN PROX 35W (STAPLE) IMPLANT
STRIP CLOSURE SKIN 1/2X4 (GAUZE/BANDAGES/DRESSINGS) IMPLANT
SUT MNCRL AB 4-0 PS2 18 (SUTURE) IMPLANT
SUT VIC AB 0 CT1 36 (SUTURE) ×1 IMPLANT
SUT VIC AB 1 CT1 36 (SUTURE) ×2 IMPLANT
SUT VIC AB 2-0 CT1 TAPERPNT 27 (SUTURE) ×2 IMPLANT
TAPE STRIPS DRAPE STRL (GAUZE/BANDAGES/DRESSINGS) IMPLANT
TOWEL GREEN STERILE FF (TOWEL DISPOSABLE) ×1 IMPLANT
TRAY FOLEY MTR SLVR 16FR STAT (SET/KITS/TRAYS/PACK) IMPLANT
WATER STERILE IRR 1000ML POUR (IV SOLUTION) ×2 IMPLANT
YANKAUER SUCT BULB TIP NO VENT (SUCTIONS) ×1 IMPLANT

## 2023-06-18 NOTE — Anesthesia Procedure Notes (Signed)
 Spinal  Patient location during procedure: OR End time: 06/18/2023 8:47 AM Reason for block: surgical anesthesia Staffing Performed: anesthesiologist  Anesthesiologist: Jonne Netters, MD Performed by: Jonne Netters, MD Authorized by: Jonne Netters, MD   Preanesthetic Checklist Completed: patient identified, IV checked, site marked, risks and benefits discussed, surgical consent, monitors and equipment checked, pre-op evaluation and timeout performed Spinal Block Patient position: sitting Prep: DuraPrep Patient monitoring: heart rate, cardiac monitor, continuous pulse ox and blood pressure Approach: midline Location: L3-4 Injection technique: single-shot Needle Needle type: Pencan and Introducer  Needle gauge: 24 G Needle length: 9 cm Assessment Sensory level: T4 Events: CSF return Additional Notes Pt identified in Operating room.  Monitors applied. Working IV access confirmed. Sterile prep, drape lumbar spine.  1% lido local L 3,4.  #24ga Pencan into clear CSF L 3,4.  13.5 mg 0.75% Bupivacaine  with dextrose  injected with asp CSF beginning and end of injection.  Patient asymptomatic, VSS, no heme aspirated, tolerated well.  Fay Hoop, MD

## 2023-06-18 NOTE — Transfer of Care (Signed)
 Immediate Anesthesia Transfer of Care Note  Patient: Frank Clarke  Procedure(s) Performed: ARTHROPLASTY, KNEE, TOTAL (Right: Knee)  Patient Location: PACU  Anesthesia Type:Spinal  Level of Consciousness: awake, alert , and oriented  Airway & Oxygen Therapy: Patient Spontanous Breathing and Patient connected to face mask oxygen  Post-op Assessment: Report given to RN  Post vital signs: Reviewed and stable  Last Vitals:  Vitals Value Taken Time  BP 134/74 06/18/23 1036  Temp    Pulse 93 06/18/23 1037  Resp 21 06/18/23 1037  SpO2 94 % 06/18/23 1037  Vitals shown include unfiled device data.  Last Pain:  Vitals:   06/18/23 0654  TempSrc:   PainSc: 0-No pain         Complications: No notable events documented.

## 2023-06-18 NOTE — Discharge Instructions (Signed)

## 2023-06-18 NOTE — Evaluation (Signed)
 Physical Therapy Evaluation Patient Details Name: Frank Clarke MRN: [id number] DOB: 12/08/1980 Today's Date: 06/18/2023  History of Present Illness  43 yo male presents to therapy s/p R TKA on 06/18/2023 due to failure of conservative measures. Pt PMH includes but is not limited to: HTN, IBS, OSA, arthritis, LBP, knee and shoulder surgery.  Clinical Impression    OLLY SHINER is a 43 y.o. male POD 0 s/p R TKA. Patient reports IND with mobility at baseline. Patient is now limited by functional impairments (see PT problem list below) and requires CGA and cues for transfers and gait with RW. Patient was able to ambulate 45 feet with RW and CGA and cues for safe walker management. Patient educated on safe sequencing for stair mobility with use of RW, fall risk prevention, pain management and goal, use of iceman machine, and car transfers pt and spouse verbalized understanding of safe guarding position for people assisting with mobility. Patient instructed in exercises to facilitate ROM and circulation reviewed and HO provided. Patient will benefit from continued skilled PT interventions to address impairments and progress towards PLOF. Patient has met mobility goals at adequate level for discharge home with family support and [facility] services; will continue to follow if pt continues acute stay to progress towards Mod I goals.       If plan is discharge home, recommend the following: A little help with walking and/or transfers;A little help with bathing/dressing/bathroom;Assistance with cooking/housework;Assist for transportation;Help with stairs or ramp for entrance   Can travel by private vehicle        Equipment Recommendations Rolling walker (2 wheels)  Recommendations for Other Services       Functional Status Assessment Patient has had a recent decline in their functional status and demonstrates the ability to make significant improvements in function in a reasonable and predictable  amount of time.     Precautions / Restrictions Precautions Precautions: Knee;Fall Restrictions Weight Bearing Restrictions Per Provider Order: No      Mobility  Bed Mobility Overal bed mobility: Needs Assistance Bed Mobility: Supine to Sit     Supine to sit: Supervision, HOB elevated     General bed mobility comments: min cues    Transfers Overall transfer level: Needs assistance Equipment used: Rolling walker (2 wheels) Transfers: Sit to/from Stand Sit to Stand: Contact guard assist           General transfer comment: min cues    Ambulation/Gait Ambulation/Gait assistance: Contact guard assist Gait Distance (Feet): 45 Feet Assistive device: Rolling walker (2 wheels) Gait Pattern/deviations: Step-to pattern, Decreased stance time - right, Antalgic, Trunk flexed Gait velocity: decreased     General Gait Details: trunk flexion with B UE support at RW to offload R LE in stance phase with pt initially having heavy reliance on B UE then pt able to progress to increased R LE WB, min cues for Rw management  Stairs Stairs: Yes Stairs assistance: Contact guard assist Stair Management: Two rails Number of Stairs: 3 General stair comments: pt and spouse ed provided on use of RW to navigate on small step to enter home, pt performed step navigation with min cues and B handrail  Wheelchair Mobility     Tilt Bed    Modified Rankin (Stroke Patients Only)       Balance Overall balance assessment: Needs assistance Sitting-balance support: Feet supported Sitting balance-Leahy Scale: Good     Standing balance support: Bilateral upper extremity supported, During functional activity, Reliant on assistive device  for balance Standing balance-Leahy Scale: Fair                               Pertinent Vitals/Pain Pain Assessment Pain Assessment: 0-10 Pain Score: 4  Pain Location: R knee and LBP Pain Descriptors / Indicators: Aching, Constant, Discomfort,  Dull, Grimacing, Operative site guarding Pain Intervention(s): Limited activity within patient's tolerance, Monitored during session, Premedicated before session, Repositioned, Ice applied    Home Living Family/patient expects to be discharged to:: Private residence Living Arrangements: Spouse/significant other Available Help at Discharge: Family Type of Home: House Home Access: Stairs to enter Entrance Stairs-Rails: None Entrance Stairs-Number of Steps: 1   Home Layout: Two level;Able to live on main level with bedroom/bathroom Home Equipment: None      Prior Function Prior Level of Function : Independent/Modified Independent;Driving;Working/employed             Mobility Comments: IND no AD for all ADLs, self care tasks and IADLs       Extremity/Trunk Assessment        Lower Extremity Assessment Lower Extremity Assessment: RLE deficits/detail RLE Deficits / Details: ankle DF/PF 5/5; SLR < 10 degree lag RLE Sensation: WNL (buttocks abn sensation once seated EOB)    Cervical / Trunk Assessment Cervical / Trunk Assessment: Normal  Communication   Communication Communication: No apparent difficulties    Cognition Arousal: Alert Behavior During Therapy: WFL for tasks assessed/performed   PT - Cognitive impairments: No apparent impairments                         Following commands: Intact       Cueing       General Comments      Exercises Total Joint Exercises Ankle Circles/Pumps: AROM, Both, 10 reps Quad Sets: AROM, 5 reps, Right Short Arc Quad: AROM, 5 reps, Right Heel Slides: AROM, 5 reps, Right Hip ABduction/ADduction: AROM, 5 reps, Right Straight Leg Raises: AROM, Right, 5 reps Knee Flexion: AROM, Right, 5 reps, Seated   Assessment/Plan    PT Assessment Patient needs continued PT services  PT Problem List Decreased strength;Decreased range of motion;Decreased activity tolerance;Decreased balance;Decreased mobility;Decreased  coordination;Pain       PT Treatment Interventions DME instruction;Gait training;Stair training;Functional mobility training;Therapeutic activities;Therapeutic exercise;Balance training;Neuromuscular re-education;Patient/family education;Modalities    PT Goals (Current goals can be found in the Care Plan section)  Acute Rehab PT Goals Patient Stated Goal: to be able to do anything I want without knee pain PT Goal Formulation: With patient Time For Goal Achievement: 07/02/23 Potential to Achieve Goals: Good    Frequency 7X/week     Co-evaluation               AM-PAC PT "6 Clicks" Mobility  Outcome Measure Help needed turning from your back to your side while in a flat bed without using bedrails?: None Help needed moving from lying on your back to sitting on the side of a flat bed without using bedrails?: None Help needed moving to and from a bed to a chair (including a wheelchair)?: A Little Help needed standing up from a chair using your arms (e.g., wheelchair or bedside chair)?: A Little Help needed to walk in hospital room?: A Little Help needed climbing 3-5 steps with a railing? : A Little 6 Click Score: 20    End of Session Equipment Utilized During Treatment: Gait belt Activity Tolerance: Patient tolerated  treatment well;No increased pain Patient left: in chair;with call bell/phone within reach;with family/visitor present Nurse Communication: Mobility status;Other (comment) (pt readiness for d/c from PT standpoint) PT Visit Diagnosis: Unsteadiness on feet (R26.81);Other abnormalities of gait and mobility (R26.89);Muscle weakness (generalized) (M62.81);Difficulty in walking, not elsewhere classified (R26.2);Pain Pain - Right/Left: Right Pain - part of body: Knee;Leg    Time: 0981-1914 PT Time Calculation (min) (ACUTE ONLY): 41 min   Charges:   PT Evaluation $PT Eval Low Complexity: 1 Low PT Treatments $Gait Training: 8-22 mins $Therapeutic Exercise: 8-22  mins PT General Charges $$ ACUTE PT VISIT: 1 Visit         Cary Clarks, PT Acute Rehab   Annalee Kiang 06/18/2023, 1:48 PM

## 2023-06-18 NOTE — Anesthesia Procedure Notes (Signed)
 Anesthesia Regional Block: Adductor canal block   Pre-Anesthetic Checklist: , timeout performed,  Correct Patient, Correct Site, Correct Laterality,  Correct Procedure, Correct Position, site marked,  Risks and benefits discussed,  Surgical consent,  Pre-op evaluation,  At surgeon's request and post-op pain management  Laterality: Right and Lower  Prep: chloraprep       Needles:  Injection technique: Single-shot  Needle Type: Echogenic Needle     Needle Length: 9cm  Needle Gauge: 21     Additional Needles:   Procedures:,,,, ultrasound used (permanent image in chart),,    Narrative:  Start time: 06/18/2023 8:22 AM End time: 06/18/2023 8:28 AM Injection made incrementally with aspirations every 5 mL.  Performed by: Personally  Anesthesiologist: Jonne Netters, MD  Additional Notes: Pt identified in Holding room.  Monitors applied. Working IV access confirmed. Timeout, Sterile prep R thigh.  #21ga ECHOgenic Arrow block needle into adductor canal with US  guidance.  20cc 0.75% Ropivacaine injected incrementally after negative test dose.  Patient asymptomatic, VSS, no heme aspirated, tolerated well.   Fay Hoop, MD

## 2023-06-18 NOTE — Op Note (Signed)
 Operative Note  Date of operation: 06/18/2023 Preoperative diagnosis: Right knee primary osteoarthritis Postoperative diagnosis: Same  Procedure: Right press-fit total knee arthroplasty  Implants: Biomet/Zimmer persona press-fit knee system Implant Name Type Inv. Item Serial No. Manufacturer Lot No. LRB No. Used Action  COMPONENT PATELLA PEG 3 32 - [id number] Joint COMPONENT PATELLA PEG 3 32  ZIMMER [employer]ON(ORTH,TRAU,BIO,SG) [id number] Right 1 Implanted  INSERT TIB ARTISURF [id number] - [id number] Insert INSERT TIB ARTISURF [id number]  ZIMMER [employer]ON(ORTH,TRAU,BIO,SG) [id number] Right 1 Implanted  PROSTHESIS FEM KNEE PS STD9 RT - [id number] Joint PROSTHESIS FEM KNEE PS STD9 RT  ZIMMER [employer]ON(ORTH,TRAU,BIO,SG) [id number] Right 1 Implanted  PROSTHESIS TIB KNEE PS 0D F RT - [id number] Joint PROSTHESIS TIB KNEE PS 0D F RT  ZIMMER RECON(ORTH,TRAU,BIO,SG) [id number] Right 1 Implanted   Surgeon: Jeanella Milan. Lucienne Ryder, MD Assistant: Malena Scull, PA-C  Anesthesia: #1 right lower extremity adductor canal block, #2 spinal, #3 local Tourniquet time: Under 1 hour EBL: Less than 50 cc Antibiotics: IV Ancef  Complications: None  Indications: The patient is a very pleasant and active 43 year old gentleman who unfortunately has debilitating arthritis of his right knee at a young age.  He has had several arthroscopic surgeries on that knee and has had multiple rounds of steroid injections and hyaluronic acid injections.  His x-rays showed significant arthritis of the right knee.  At this point his right knee pain is daily and it is detrimentally affecting his mobility, his quality of life and his actives daily living.  At this point he does wish to proceed with a total knee arthroplasty and we agree with this as well.  We discussed in length in detail the risks of acute blood loss anemia, nerve vessel injury, fracture, infection, DVT, implant failure and wound healing issues.  He understands that our goals are hopefully  decreased pain, improved mobility and improved quality of life.  Procedure description: After informed consent was obtained and the appropriate right hip was marked, right lower extremity adductor canal block was obtained in the holding room by anesthesia.  He was then brought to the operating room and set up on the operating table where spinal anesthesia was obtained.  He was then laid in supine position on the operating table and a Foley catheter was placed.  A nonsterile tractors placed around his upper right thigh and his right thigh, knee, leg, ankle and foot were prepped and draped with DuraPrep and sterile drapes including a sterile stockinette.  A timeout was called and he was identified as the correct patient and the correct right knee.  An Esmarch was then used to wrap out the leg and the tourniquet was inflated to 300 mm pressure.  With the knee extended a direct midline incision was made over the patella and carried proximally distally.  Dissection was carried down to the knee joint and a medial parapatellar arthrotomy is made.  Right away could see there was severe arthritis of the trochlear groove and the patella itself.  With the knee in a flexed position we found significant cartilage wear laterally and medial.  We removed remnants of the medial and lateral meniscus as well as the ACL.  Osteophytes removed from all 3 compartments.  With the knee in a flexed position we used an extramedullary cutting guide for making our proximal tibia cut correction for varus and valgus and a 7 degree slope.  We made this cut to take 2 mm off the low side and we backed this down for more millimeters.  We then  back to the femur and used our distal femoral cutting guide which was intramedullary based for a right knee at 5 degrees externally rotated and a 10 mm distal femoral cut.  We then brought the knee back down to full extension and with a 10 mm extension block at achieve full extension.  We then went back to the  femur and put a femoral sizing guide based off the epicondylar axis.  Based off of this we chose a size 9 femur.  We put a 4 and 1 cutting block and made our anterior and posterior cuts of the distal femur as well as her chamfer cuts.  We then went back to the tibia and chose a size F right tibial tray for coverage over the tibial plateau setting the rotation of the tibial tubercle and the femur.  We did our drill hole and keel punch off of this and found excellent quality bone for press-fit implants which is appropriate giving his young age of only 42 years old.  We then trialed our size F right tibial tray followed by our size 9 right CR standard femur.  We trialed a 10 mm thickness right medial congruent polythene insert and went up to a 12 mm thickness insert we are pleased with range of motion and stability without insert.  We then made a patella cut and drilled 3 holes for a size 32 press-fit patella button.  Again with all trial instrumentation in the knee we are pleased with range of motion and stability.  We then removed all trial instrumentation of the knee and irrigated the knee with normal saline solution.  Marcaine  with epinephrine  was then placed around the arthrotomy.  Next our implants were opened and with the knee in a flexed position we placed our Biomet/Zimmer persona tibial tray for a right knee size F followed by press fitting our size 9 right CR standard femur.  We placed our 12 mm thickness right medial congruent polythene insert and press-fit our size 32 patella button.  Again we put the knee through range of motion we replaced the range of motion and stability.  The tourniquet was let down and hemostasis was obtained with electrocautery.  The arthrotomy was then closed with interrupted #1 Vicryl suture followed by 0 Vicryl close the deep tissue and 2-0 Vicryl to close the subcutaneous tissue.  A 4-0 Monocryl subcuticular stitch was placed as well as Steri-Strips and well-padded sterile  dressing.  The patient was then taken off the operating table and a Foley catheter was removed and he was taken to the recovery room.  Malena Scull, PA-C did assist during the entire case and beginning to end and his assistance was crucial and medically necessary for soft tissue management and retraction, helping guide implant placement and a layered closure of the wound.

## 2023-06-18 NOTE — Anesthesia Postprocedure Evaluation (Signed)
 Anesthesia Post Note  Patient: Frank Clarke  Procedure(s) Performed: ARTHROPLASTY, KNEE, TOTAL (Right: Knee)     Patient location during evaluation: Phase II Anesthesia Type: Spinal Level of consciousness: oriented, patient cooperative and awake and alert Pain management: pain level controlled Vital Signs Assessment: post-procedure vital signs reviewed and stable Respiratory status: spontaneous breathing, nonlabored ventilation and respiratory function stable Cardiovascular status: blood pressure returned to baseline and stable Postop Assessment: patient able to bend at knees, adequate PO intake, no backache and spinal receding Anesthetic complications: no   No notable events documented.  Last Vitals:  Vitals:   06/18/23 1152 06/18/23 1202  BP: 130/79 (P) 132/87  Pulse: 91 (P) 89  Resp: 20   Temp:  (!) (P) 36.4 C  SpO2: 93% (P) 90%    Last Pain:  Vitals:   06/18/23 1202  TempSrc: (P) Oral  PainSc:                  Maliyah Willets,E. Chrisanne Loose

## 2023-06-18 NOTE — Interval H&P Note (Signed)
 History and Physical Interval Note: The patient is here today for a right total knee replacement to treat his significant right knee pain and arthritis.  There has been no acute or interval change in his medical status.  Please see H&P.  The risks and benefits of surgery have been discussed in detail and informed consent has been obtained.  The right operative knee has been marked.  06/18/2023 7:10 AM  Frank Clarke  has presented today for surgery, with the diagnosis of right knee osteoarthritis.  The various methods of treatment have been discussed with the patient and family. After consideration of risks, benefits and other options for treatment, the patient has consented to  Procedure(s): ARTHROPLASTY, KNEE, TOTAL (Right) as a surgical intervention.  The patient's history has been reviewed, patient examined, no change in status, stable for surgery.  I have reviewed the patient's chart and labs.  Questions were answered to the patient's satisfaction.     [name]

## 2023-06-21 ENCOUNTER — Encounter (HOSPITAL_COMMUNITY): Payer: Self-pay | Admitting: Orthopaedic Surgery

## 2023-06-24 ENCOUNTER — Other Ambulatory Visit: Payer: Self-pay | Admitting: Physician Assistant

## 2023-06-24 MED ORDER — GABAPENTIN 300 MG PO CAPS: 300.0000 mg | ORAL_CAPSULE | Freq: Two times a day (BID) | ORAL | 1 refills | Status: DC

## 2023-06-26 ENCOUNTER — Other Ambulatory Visit: Payer: Self-pay | Admitting: Orthopaedic Surgery

## 2023-06-26 MED ORDER — HYDROMORPHONE HCL 2 MG PO TABS: 2.0000 mg | ORAL_TABLET | Freq: Four times a day (QID) | ORAL | 0 refills | Status: DC | PRN

## 2023-07-01 ENCOUNTER — Ambulatory Visit (INDEPENDENT_AMBULATORY_CARE_PROVIDER_SITE_OTHER): Admitting: Orthopaedic Surgery

## 2023-07-01 ENCOUNTER — Encounter: Payer: Self-pay | Admitting: Orthopaedic Surgery

## 2023-07-01 ENCOUNTER — Other Ambulatory Visit: Payer: Self-pay

## 2023-07-01 DIAGNOSIS — Z96651 Presence of right artificial knee joint: Secondary | ICD-10-CM | POA: Insufficient documentation

## 2023-07-01 MED ORDER — HYDROMORPHONE HCL 2 MG PO TABS: 2.0000 mg | ORAL_TABLET | Freq: Four times a day (QID) | ORAL | 0 refills | Status: DC | PRN

## 2023-07-01 NOTE — Progress Notes (Signed)
 Frank Clarke is here for his first postoperative visit at 2 weeks status post a right total knee arthroplasty to treat severe right knee arthritis and pain.  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  He has been pushing himself hard through home health PT working on his range of motion as well as balance, coordination, and strength.  A lot of his pain is in the evening.  He has been having to take a combination of Dilaudid  and Neurontin .  He has also been on Celebrex   On examination today his right knee incision looks great.  His extension is almost full and his flexion is to 100 degrees.  His calf is soft.  There is no foot ankle swelling.  We will now transition him to outpatient physical therapy.  He can resume light duty work until further notice starting Monday.  He knows to hold off on driving at least another week to 2 weeks depending on how his strength is.  I will send in some more pain medication for him as well.  We will then see him back in a month for repeat exam but no x-rays are needed.

## 2023-07-08 ENCOUNTER — Encounter: Payer: Self-pay | Admitting: Rehabilitative and Restorative Service Providers"

## 2023-07-08 ENCOUNTER — Ambulatory Visit: Admitting: Rehabilitative and Restorative Service Providers"

## 2023-07-08 DIAGNOSIS — M25561 Pain in right knee: Secondary | ICD-10-CM

## 2023-07-08 DIAGNOSIS — M6281 Muscle weakness (generalized): Secondary | ICD-10-CM | POA: Diagnosis not present

## 2023-07-08 DIAGNOSIS — R600 Localized edema: Secondary | ICD-10-CM

## 2023-07-08 DIAGNOSIS — M25661 Stiffness of right knee, not elsewhere classified: Secondary | ICD-10-CM | POA: Diagnosis not present

## 2023-07-08 DIAGNOSIS — R262 Difficulty in walking, not elsewhere classified: Secondary | ICD-10-CM

## 2023-07-08 NOTE — Therapy (Signed)
 OUTPATIENT PHYSICAL THERAPY LOWER EXTREMITY EVALUATION   Patient Name: Frank Clarke MRN: [id number] DOB:Apr 08, 1980, 43 y.o., male Today's Date: 07/08/2023  END OF SESSION:  PT End of Session - 07/08/23 1429     Visit Number 1    Number of Visits 16    Date for PT Re-Evaluation 09/02/23    PT Start Time 1347    PT Stop Time 1432    PT Time Calculation (min) 45 min    Activity Tolerance Patient tolerated treatment well;No increased pain    Behavior During Therapy [facility] for tasks assessed/performed          Past Medical History:  Diagnosis Date   Arthritis    Dental crowns present    Heart murmur    Hypertension    Irritable bowel    Perianal abscess 10/2016   Sinus congestion 10/29/2016   Sleep apnea    cpap   Past Surgical History:  Procedure Laterality Date   ASD REPAIR     age 60   CHOLECYSTECTOMY     EVALUATION UNDER ANESTHESIA WITH FISTULECTOMY Left 11/04/2016   Procedure: EXAM UNDER ANESTHESIA WITH FISTULOTOMY;  Surgeon: Oza Blumenthal, MD;  Location: [facility];  Service: General;  Laterality: Left;   KNEE ARTHROSCOPY Right    x 2   KNEE ARTHROSCOPY Left    SHOULDER ARTHROSCOPY Right    TOTAL KNEE ARTHROPLASTY Right 06/18/2023   Procedure: ARTHROPLASTY, KNEE, TOTAL;  Surgeon: [name], MD;  Location: [facility];  Service: Orthopedics;  Laterality: Right;   Patient Active Problem List   Diagnosis Date Noted   Status post total right knee replacement 07/01/2023   Unilateral primary osteoarthritis, right knee 06/17/2023    PCP: Jearlean Mince, PA-C  REFERRING PROVIDER: [name], MD  REFERRING DIAG: [phone] (ICD-10-CM) - Status post total right knee replacement  THERAPY DIAG:  Difficulty in walking, not elsewhere classified - Plan: PT plan of care cert/re-cert  Muscle weakness (generalized) - Plan: PT plan of care cert/re-cert  Localized edema - Plan: PT plan of care cert/re-cert  Stiffness of right  knee, not elsewhere classified - Plan: PT plan of care cert/re-cert  Acute pain of right knee - Plan: PT plan of care cert/re-cert  Rationale for Evaluation and Treatment: Rehabilitation  ONSET DATE: Jun 18, 2023 Rt TKA  SUBJECTIVE:   SUBJECTIVE STATEMENT: Frank Clarke is already back at work, 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  He is still sleeping in a sitting position.  He gets blocks of 5 hours of sleep uninterrupted.  PERTINENT HISTORY: OA, HTN, multiple knee arthroscopies, Right shoulder arthroscopy  PAIN:  Are you having pain? Yes: NPRS scale: 2-5/10 this week Pain location: Rt knee Pain description: Achy, burns from 6-8 AM Aggravating factors: Later in the day and at night Relieving factors: Tylenol  during the day every 6 hours and 25 mg Dilaudid  2 x a day and 300 mg Gabapentin  1 x a day  PRECAUTIONS: None and Other: Left knee arthritis  RED FLAGS: None   WEIGHT BEARING RESTRICTIONS: No  FALLS:  Has patient fallen in last 6 months? No  LIVING ENVIRONMENT: Lives with: lives with their family, lives with their spouse, and lives with their daughter Lives in: House/apartment Stairs: Uses handrail and /or a cane Has following equipment at home: Single point cane  OCCUPATION: Scientist, forensic  PLOF: Independent  PATIENT GOALS: Get back to normal driving, work and functional activities  NEXT MD  VISIT: 07/29/2023  OBJECTIVE:  Note: Objective measures were completed at Evaluation unless otherwise noted.  DIAGNOSTIC FINDINGS: 2 views of the left knee show significant arthritis of the patellofemoral  joint and osteophytes in all 3 compartments.   2 views of the right knee show significant tricompartment arthritis  especially at the patellofemoral joint.  There are osteophytes in all 3  compartments.  PATIENT SURVEYS:  Patient-Specific Activity Scoring Scheme  0 represents "unable to perform." 10 represents "able to perform at prior level. 0  1 2 3 4 5 6 7 8 9 10  (Date and Score)   Activity Eval     1.  Walking 5/10    2.  Descending stairs 5/10    3.  Sleeping in a bed 5/10   4.  Sitting in a chair 8/10   5.    Score 5.75    Total score = sum of the activity scores/number of activities Minimum detectable change (90%CI) for average score = 2 points Minimum detectable change (90%CI) for single activity score = 3 points     COGNITION: Overall cognitive status: Within functional limits for tasks assessed     SENSATION: WFL  EDEMA:  Noted and not objectively assessed   LOWER EXTREMITY ROM:  Active ROM Left/Right 07/08/2023   Hip flexion    Hip extension    Hip abduction    Hip adduction    Hip internal rotation    Hip external rotation    Knee flexion 133/115   Knee extension 0/-5   Ankle dorsiflexion    Ankle plantarflexion    Ankle inversion    Ankle eversion     (Blank rows = not tested)  LOWER EXTREMITY STRENGTH:  Assessed in pounds with hand-held dynamometer Left/Right 07/08/2023   Hip flexion    Hip extension    Hip abduction    Hip adduction    Hip internal rotation    Hip external rotation    Knee flexion    Knee extension 100/64.7 pounds   Ankle dorsiflexion    Ankle plantarflexion    Ankle inversion    Ankle eversion     (Blank rows = not tested)  GAIT: Distance walked: 100 feet Assistive device utilized: Single point cane Level of assistance: Complete Independence Comments: Frank Clarke is using the cane outside the house but he mentions he is not using any assistive device inside the house                                                                                                                                TREATMENT DATE: 07/08/2023  Quadriceps sets with right heel prop 15 x 5 seconds Seated knee flexion active assisted range of motion (left pushes right into flexion) 10 x 10 seconds Seated straight leg raises 3 sets of 10 with 3 pound weights  Vaso right knee high pressure  40 degrees 10 minutes   PATIENT EDUCATION:  Education details: Reviewed exam findings and day 1 home exercises Person educated: Patient Education method: Explanation, Demonstration, Tactile cues, Verbal cues, and Handouts Education comprehension: verbalized understanding, returned demonstration, verbal cues required, tactile cues required, and needs further education  HOME EXERCISE PROGRAM: Access Code: [email or web address] URL: [email or web address] Date: 07/08/2023 Prepared by: Terral Ferrari  Exercises - Supine Quadricep Sets  - 5 x daily - 7 x weekly - 2 sets - 10 reps - 5 second hold - Seated Knee Flexion AAROM  - 3 x daily - 7 x weekly - 1 sets - 10 reps - 10 seconds hold - Small Range Straight Leg Raise  - 1 x daily - 7 x weekly - 3-5 sets - 10 reps - 3 seconds hold  ASSESSMENT:  CLINICAL IMPRESSION: Patient is a 43  y.o. male who was seen today for physical therapy evaluation and treatment for Z96.651 (ICD-10-CM) - Status post total right knee replacement.  Frank Clarke has very good active range of motion and strength for being less than 3 weeks post-surgery.  We will need to avoid overuse of the left knee, as Frank Clarke already has a history of a previous arthroscopic surgery on the left side and his imaging shows some significant wear and tear of that joint as well.  His prognosis to meet the below listed long-term goals is excellent with the recommended plan of care.  OBJECTIVE IMPAIRMENTS: Abnormal gait, decreased activity tolerance, decreased endurance, decreased knowledge of condition, difficulty walking, decreased ROM, decreased strength, increased edema, and pain.   ACTIVITY LIMITATIONS: bending, standing, squatting, sleeping, stairs, and locomotion level  PARTICIPATION LIMITATIONS: driving, community activity, and occupation  PERSONAL FACTORS: OA, HTN, multiple knee arthroscopies, Right shoulder arthroscopy are also affecting patient's functional outcome.   REHAB POTENTIAL:  Excellent  CLINICAL DECISION MAKING: Stable/uncomplicated  EVALUATION COMPLEXITY: Low   GOALS: Goals reviewed with patient? Yes  SHORT TERM GOALS: Target date: 08/05/2023 Frank Clarke will be independent with his day 1 home exercise program Baseline: Started 07/08/2023 Goal status: INITIAL  2.  Improve right knee extension active range of motion to within 3 degrees of neutral Baseline: Within 5 degrees of neutral Goal status: INITIAL  3.  Improve right knee flexion active range of motion to at least 120 degrees Baseline: 115 degrees Goal status: INITIAL   LONG TERM GOALS: Target date: 09/02/2023  Improve METS patient specific functional score to at least 7.75 Baseline: 5.75 Goal status: INITIAL  2.  Frank Clarke will report right knee pain consistently 0-3/10 on the visual analog scale Baseline: 2-5/10 Goal status: INITIAL  3.  Frank Clarke will have at least 80 pounds of right quadriceps strength as assessed by hand-held dynamometer Baseline: 64.7 pounds Goal status: INITIAL  4.  Frank Clarke will be able to walk enough to perform his essential job duties and basic ADLs without an assistive device or increasing pain Baseline: Desk duty at work and using a cane outside the home Goal status: INITIAL  5.  Frank Clarke will be independent with his long-term home maintenance exercise program at discharge Baseline: Started 07/08/2023 Goal status: INITIAL  PLAN:  PT FREQUENCY: 2x/week  PT DURATION: 8 weeks  PLANNED INTERVENTIONS: 97750- Physical Performance Testing, 97110-Therapeutic exercises, 97530- Therapeutic activity, 97112- Neuromuscular re-education, 97535- Self Care, 98119- Manual therapy, [phone]- Gait training, [phone]- Electrical stimulation (unattended), 97016- Vasopneumatic device, Patient/Family education, Balance training, Stair training, Joint mobilization, and Cryotherapy  PLAN FOR NEXT SESSION: Strength, gait, balance and functional progressions being careful not to flare up his left knee   Rob  Arma Lamp, PT, MPT 07/08/2023, 5:31 PM

## 2023-07-19 ENCOUNTER — Encounter: Payer: Self-pay | Admitting: Rehabilitative and Restorative Service Providers"

## 2023-07-19 ENCOUNTER — Ambulatory Visit (INDEPENDENT_AMBULATORY_CARE_PROVIDER_SITE_OTHER): Admitting: Rehabilitative and Restorative Service Providers"

## 2023-07-19 DIAGNOSIS — R600 Localized edema: Secondary | ICD-10-CM | POA: Diagnosis not present

## 2023-07-19 DIAGNOSIS — M6281 Muscle weakness (generalized): Secondary | ICD-10-CM

## 2023-07-19 DIAGNOSIS — M25561 Pain in right knee: Secondary | ICD-10-CM

## 2023-07-19 DIAGNOSIS — M25661 Stiffness of right knee, not elsewhere classified: Secondary | ICD-10-CM

## 2023-07-19 DIAGNOSIS — R262 Difficulty in walking, not elsewhere classified: Secondary | ICD-10-CM

## 2023-07-19 NOTE — Therapy (Signed)
 OUTPATIENT PHYSICAL THERAPY TREATMENT   Patient Name: Frank Clarke MRN: [id number] DOB:10/24/1980, 43 y.o., male Today's Date: 07/19/2023  END OF SESSION:  PT End of Session - 07/19/23 1336     Visit Number 2    Number of Visits 16    Date for PT Re-Evaluation 09/02/23    Authorization Type [employer]    Progress Note Due on Visit 10    PT Start Time 1335    PT Stop Time 1428    PT Time Calculation (min) 53 min    Activity Tolerance Patient tolerated treatment well;No increased pain    Behavior During Therapy [facility] for tasks assessed/performed           Past Medical History:  Diagnosis Date   Arthritis    Dental crowns present    Heart murmur    Hypertension    Irritable bowel    Perianal abscess 10/2016   Sinus congestion 10/29/2016   Sleep apnea    cpap   Past Surgical History:  Procedure Laterality Date   ASD REPAIR     age 69   CHOLECYSTECTOMY     EVALUATION UNDER ANESTHESIA WITH FISTULECTOMY Left 11/04/2016   Procedure: EXAM UNDER ANESTHESIA WITH FISTULOTOMY;  Surgeon: Vernetta Berg, MD;  Location: [facility];  Service: General;  Laterality: Left;   KNEE ARTHROSCOPY Right    x 2   KNEE ARTHROSCOPY Left    SHOULDER ARTHROSCOPY Right    TOTAL KNEE ARTHROPLASTY Right 06/18/2023   Procedure: ARTHROPLASTY, KNEE, TOTAL;  Surgeon: Vernetta Lonni GRADE, MD;  Location: [facility];  Service: Orthopedics;  Laterality: Right;   Patient Active Problem List   Diagnosis Date Noted   Status post total right knee replacement 07/01/2023   Unilateral primary osteoarthritis, right knee 06/17/2023    PCP: Camie Franchot Mirza, PA-C  REFERRING PROVIDER: Lonni GRADE Vernetta, MD  REFERRING DIAG: [phone] (ICD-10-CM) - Status post total right knee replacement  THERAPY DIAG:  Difficulty in walking, not elsewhere classified  Muscle weakness (generalized)  Localized edema  Stiffness of right knee, not elsewhere classified  Acute pain of right  knee  Rationale for Evaluation and Treatment: Rehabilitation  ONSET DATE: Jun 18, 2023 Rt TKA  SUBJECTIVE:   SUBJECTIVE STATEMENT: Pt indicated indicated having continued complaints in Rt buttock/posterior leg and also indicated having some Rt ankle complaints at night achy.  Pt indicated some complaints in Rt knee with going down stairs.    Pt indicated having history of back pains.    PERTINENT HISTORY: OA, HTN, multiple knee arthroscopies, Right shoulder arthroscopy  PAIN:  NPRS scale: at worst in last 24 hours: 4/10 Pain location: Rt knee Pain description: Achy, burns from 6-8 AM Aggravating factors: Later in the day and at night Relieving factors: Tylenol  during the day every 6 hours and 25 mg Dilaudid  2 x a day and 300 mg Gabapentin  1 x a day  PRECAUTIONS: None and Other: Left knee arthritis  RED FLAGS: None   WEIGHT BEARING RESTRICTIONS: No  FALLS:  Has patient fallen in last 6 months? No  LIVING ENVIRONMENT: Lives with: lives with their family, lives with their spouse, and lives with their daughter Lives in: House/apartment Stairs: Uses handrail and /or a cane Has following equipment at home: Single point cane  OCCUPATION: Scientist, forensic  PLOF: Independent  PATIENT GOALS: Get back to normal driving, work and functional activities  NEXT MD VISIT: 07/29/2023  OBJECTIVE:  Note: Objective measures were completed at Evaluation  unless otherwise noted.  DIAGNOSTIC FINDINGS:  2 views of the left knee show significant arthritis of the patellofemoral  joint and osteophytes in all 3 compartments.   2 views of the right knee show significant tricompartment arthritis  especially at the patellofemoral joint.  There are osteophytes in all 3  compartments.  PATIENT SURVEYS:  Patient-Specific Activity Scoring Scheme  0 represents "unable to perform." 10 represents "able to perform at prior level. 0 1 2 3 4 5 6 7 8 9  10 (Date and Score)   Activity Eval   06/24/17/202530/2025    1.  Walking 5/10    2.  Descending stairs 5/10    3.  Sleeping in a bed 5/10   4.  Sitting in a chair 8/10   5.    Score 5.75    Total score = sum of the activity scores/number of activities Minimum detectable change (90%CI) for average score = 2 points Minimum detectable change (90%CI) for single activity score = 3 points     COGNITION: 07/08/2023 Overall cognitive status: Within functional limits for tasks assessed     SENSATION: 07/08/2023 [facility]  EDEMA:  07/08/2023 Noted and not objectively assessed   LOWER EXTREMITY ROM:  Active ROM Right 07/08/2023 Left 07/08/2023  Hip flexion    Hip extension    Hip abduction    Hip adduction    Hip internal rotation    Hip external rotation    Knee flexion 115 133  Knee extension -5 0  Ankle dorsiflexion    Ankle plantarflexion    Ankle inversion    Ankle eversion     (Blank rows = not tested)  LOWER EXTREMITY STRENGTH:   Left/Right 07/08/2023 dynamometry Left 07/08/2023 dynamometry  Hip flexion    Hip extension    Hip abduction    Hip adduction    Hip internal rotation    Hip external rotation    Knee flexion    Knee extension 64.7 lbs 100 lbs   Ankle dorsiflexion    Ankle plantarflexion    Ankle inversion    Ankle eversion     (Blank rows = not tested)  GAIT: 07/19/2023: Independent ambulation c no observed deviations in clinic level surface ambulation.   07/08/2023 Distance walked: 100 feet Assistive device utilized: Single point cane Level of assistance: Complete Independence Comments: [name] is using the cane outside the house but he mentions he is not using any assistive device inside the house                   TREATMENT         DATE:  07/19/2023 Therex: Recumbent bike Lvl 3 10 mins - seat 9  DF stretch on step with hands on rails 2-3 sec hold x 5 for Rt ankle DF mobility Incline gastroc stretch 30 sec x 3 bilateral  Standing lumbar extension instruction  Supine SKC 15 sec x 3  Rt  Supine figure 4 pull towards 15 sec x 5 Rt Supine bridge 2-3 sec hold x 10    Neuro Re-ed: SLS on foam with contralateral leg touching corners lightly x 8 each, performed bilaterally  SLS on foam with contralateral leg step over/back 8 inch hurdle x 15 bilateral  Fitter rocker board fwd/back light touching x 20, balance attempt 1 min in // bars with occasional HHA    TherActivity: (to improve stairs, squatting, transfers) Leg press double leg 100 lbs x 15, 125 lbs x15 , Rt leg x 15  50 lbs  (cues for gym use and setup) Lateral step down 4 inch step WB on Rt leg x 10, 6 inch x 2.   Step on over and down 4 inch step WB on Rt leg x 10   Vaso deferred by patient today   TREATMENT         DATE: 07/08/2023  Quadriceps sets with right heel prop 15 x 5 seconds Seated knee flexion active assisted range of motion (left pushes right into flexion) 10 x 10 seconds Seated straight leg raises 3 sets of 10 with 3 pound weights  Vaso right knee high pressure 40 degrees 10 minutes   PATIENT EDUCATION:  Education details: Reviewed exam findings and day 1 home exercises Person educated: Patient Education method: Explanation, Demonstration, Tactile cues, Verbal cues, and Handouts Education comprehension: verbalized understanding, returned demonstration, verbal cues required, tactile cues required, and needs further education  HOME EXERCISE PROGRAM: Access Code: [email or web address] URL: [email or web address] Date: 07/19/2023 Prepared by: Ozell Silvan  Exercises - Supine Quadricep Sets  - 5 x daily - 7 x weekly - 2 sets - 10 reps - 5 second hold - Seated Knee Flexion AAROM  - 3 x daily - 7 x weekly - 1 sets - 10 reps - 10 seconds hold - Small Range Straight Leg Raise  - 1 x daily - 7 x weekly - 3-5 sets - 10 reps - 3 seconds hold - Lateral Step Down (Mirrored)  - 1 x daily - 3-4 x weekly - 1-2 sets - 10 reps - Standing Dorsiflexion Self-Mobilization on Step  - 1-2 x daily - 7 x weekly -  1 sets - 10 reps - 2-3 hold - Standing Bilateral Gastroc Stretch with Step  - 1-2 x daily - 7 x weekly - 1 sets - 3-5 reps - 30 hold - Supine Single Knee to Chest Stretch  - 2 x daily - 7 x weekly - 1 sets - 3-5 reps - 15 hold - Supine Piriformis Stretch with Foot on Ground  - 2 x daily - 7 x weekly - 1 sets - 5 reps - 30 hold - Supine Bridge  - 1-2 x daily - 7 x weekly - 1-2 sets - 10 reps - 2 hold  ASSESSMENT:  CLINICAL IMPRESSION: Good presentation overall today.  Spent time in review of progression of strengthening including gym based leg press, step up/down activity as performed in clinic.  Range continued to look really good.  Included some intervention in HEP to promote mobility from back/hip for those symptoms.   OBJECTIVE IMPAIRMENTS: Abnormal gait, decreased activity tolerance, decreased endurance, decreased knowledge of condition, difficulty walking, decreased ROM, decreased strength, increased edema, and pain.   ACTIVITY LIMITATIONS: bending, standing, squatting, sleeping, stairs, and locomotion level  PARTICIPATION LIMITATIONS: driving, community activity, and occupation  PERSONAL FACTORS: OA, HTN, multiple knee arthroscopies, Right shoulder arthroscopy are also affecting patient's functional outcome.   REHAB POTENTIAL: Excellent  CLINICAL DECISION MAKING: Stable/uncomplicated  EVALUATION COMPLEXITY: Low   GOALS: Goals reviewed with patient? Yes  SHORT TERM GOALS: Target date: 08/05/2023 [name] will be independent with his day 1 home exercise program Baseline: Started 07/08/2023 Goal status: on going 07/19/2023  2.  Improve right knee extension active range of motion to within 3 degrees of neutral Baseline: Within 5 degrees of neutral Goal status: on going 07/19/2023  3.  Improve right knee flexion active range of motion to at least 120 degrees Baseline: 115 degrees Goal status: on going 07/19/2023  LONG TERM GOALS: Target date: 09/02/2023  Improve METS patient  specific functional score to at least 7.75 Baseline: 5.75 Goal status: INITIAL  2.  [name] will report right knee pain consistently 0-3/10 on the visual analog scale Baseline: 2-5/10 Goal status: INITIAL  3.  [name] will have at least 80 pounds of right quadriceps strength as assessed by hand-held dynamometer Baseline: 64.7 pounds Goal status: INITIAL  4.  [name] will be able to walk enough to perform his essential job duties and basic ADLs without an assistive device or increasing pain Baseline: Desk duty at work and using a cane outside the home Goal status: INITIAL  5.  [name] will be independent with his long-term home maintenance exercise program at discharge Baseline: Started 07/08/2023 Goal status: INITIAL  PLAN:  PT FREQUENCY: 2x/week  PT DURATION: 8 weeks  PLANNED INTERVENTIONS: 97750- Physical Performance Testing, 97110-Therapeutic exercises, 97530- Therapeutic activity, 97112- Neuromuscular re-education, 97535- Self Care, [id number]- Manual therapy, [phone]- Gait training, [phone]- Electrical stimulation (unattended), 97016- Vasopneumatic device, Patient/Family education, Balance training, Stair training, Joint mobilization, and Cryotherapy  PLAN FOR NEXT SESSION: Progressive WB strengthening, movement coordination improvements.    Ozell Silvan, PT, DPT, OCS, ATC 07/19/23  2:27 PM

## 2023-07-21 ENCOUNTER — Encounter: Payer: Self-pay | Admitting: Rehabilitative and Restorative Service Providers"

## 2023-07-21 ENCOUNTER — Ambulatory Visit (INDEPENDENT_AMBULATORY_CARE_PROVIDER_SITE_OTHER): Admitting: Rehabilitative and Restorative Service Providers"

## 2023-07-21 DIAGNOSIS — R262 Difficulty in walking, not elsewhere classified: Secondary | ICD-10-CM | POA: Diagnosis not present

## 2023-07-21 DIAGNOSIS — R600 Localized edema: Secondary | ICD-10-CM

## 2023-07-21 DIAGNOSIS — M25561 Pain in right knee: Secondary | ICD-10-CM

## 2023-07-21 DIAGNOSIS — M25661 Stiffness of right knee, not elsewhere classified: Secondary | ICD-10-CM | POA: Diagnosis not present

## 2023-07-21 DIAGNOSIS — M6281 Muscle weakness (generalized): Secondary | ICD-10-CM

## 2023-07-21 NOTE — Therapy (Signed)
 OUTPATIENT PHYSICAL THERAPY TREATMENT   Patient Name: Frank Clarke MRN: [id number] DOB:[birth year], 43 y.o., male Today's Date: 07/21/2023  END OF SESSION:  PT End of Session - 07/21/23 1052     Visit Number 3    Number of Visits 16    Date for PT Re-Evaluation 09/02/23    Authorization Type U[employer]    Progress Note Due on Visit 10    PT Start Time 1052    PT Stop Time 1147    PT Time Calculation (min) 55 min    Activity Tolerance Patient tolerated treatment well;No increased pain    Behavior During Therapy [facility] for tasks assessed/performed            Past Medical History:  Diagnosis Date   Arthritis    Dental crowns present    Heart murmur    Hypertension    Irritable bowel    Perianal abscess 10/2016   Sinus congestion 10/29/2016   Sleep apnea    cpap   Past Surgical History:  Procedure Laterality Date   ASD REPAIR     age 86   CHOLECYSTECTOMY     EVALUATION UNDER ANESTHESIA WITH FISTULECTOMY Left 11/04/2016   Procedure: EXAM UNDER ANESTHESIA WITH FISTULOTOMY;  Surgeon: Frank Berg, MD;  Location: [facility];  Service: General;  Laterality: Left;   KNEE ARTHROSCOPY Right    x 2   KNEE ARTHROSCOPY Left    SHOULDER ARTHROSCOPY Right    TOTAL KNEE ARTHROPLASTY Right 06/18/2023   Procedure: ARTHROPLASTY, KNEE, TOTAL;  Surgeon: Frank Frank GRADE, MD;  Location: [facility];  Service: Orthopedics;  Laterality: Right;   Patient Active Problem List   Diagnosis Date Noted   Status post total right knee replacement 07/01/2023   Unilateral primary osteoarthritis, right knee 06/17/2023    PCP: Frank Franchot Mirza, PA-C  REFERRING PROVIDER: Lonni GRADE Vernetta, MD  REFERRING DIAG: [phone] (ICD-10-CM) - Status post total right knee replacement  THERAPY DIAG:  Difficulty in walking, not elsewhere classified  Muscle weakness (generalized)  Localized edema  Stiffness of right knee, not elsewhere classified  Acute pain of right  knee  Rationale for Evaluation and Treatment: Rehabilitation  ONSET DATE: Jun 18, 2023 Rt TKA  SUBJECTIVE:   SUBJECTIVE STATEMENT: Frank Clarke notes sleep is still on the couch and interrupted.  He reports good HEP compliance.  Pt indicated having history of back pains.  PERTINENT HISTORY: OA, HTN, multiple knee arthroscopies, Right shoulder arthroscopy  PAIN:  NPRS scale: 2-5/10 this week Pain location: Rt knee Pain description: Achy, sore, stiff Aggravating factors: Later in the day and at night Relieving factors: Tylenol  during the day every 6 hours and 300 mg Gabapentin  1 x a day  PRECAUTIONS: None and Other: Left knee arthritis  RED FLAGS: None   WEIGHT BEARING RESTRICTIONS: No  FALLS:  Has patient fallen in last 6 months? No  LIVING ENVIRONMENT: Lives with: lives with their family, lives with their spouse, and lives with their daughter Lives in: House/apartment Stairs: Uses handrail and /or a cane Has following equipment at home: Single point cane  OCCUPATION: Scientist, forensic  PLOF: Independent  PATIENT GOALS: Get back to normal driving, work and functional activities  NEXT MD VISIT: 07/29/2023  OBJECTIVE:  Note: Objective measures were completed at Evaluation unless otherwise noted.  DIAGNOSTIC FINDINGS:  2 views of the left knee show significant arthritis of the patellofemoral  joint and osteophytes in all 3 compartments.   2 views of the right  knee show significant tricompartment arthritis  especially at the patellofemoral joint.  There are osteophytes in all 3  compartments.  PATIENT SURVEYS:  Patient-Specific Activity Scoring Scheme  0 represents "unable to perform." 10 represents "able to perform at prior level. 0 1 2 3 4 5 6 7 8 9  10 (Date and Score)   Activity Eval  06/24/17/202530/2025    1.  Walking 5/10    2.  Descending stairs 5/10    3.  Sleeping in a bed 5/10   4.  Sitting in a chair 8/10   5.    Score 5.75    Total score =  sum of the activity scores/number of activities Minimum detectable change (90%CI) for average score = 2 points Minimum detectable change (90%CI) for single activity score = 3 points     COGNITION: 07/08/2023 Overall cognitive status: Within functional limits for tasks assessed     SENSATION: 07/08/2023 [facility]  EDEMA:  07/08/2023 Noted and not objectively assessed   LOWER EXTREMITY ROM:  Active ROM Right 07/08/2023 Left 07/08/2023 Right 07/21/2023  Hip flexion     Hip extension     Hip abduction     Hip adduction     Hip internal rotation     Hip external rotation     Knee flexion 115 133 122  Knee extension -5 0 -3  Ankle dorsiflexion     Ankle plantarflexion     Ankle inversion     Ankle eversion      (Blank rows = not tested)  LOWER EXTREMITY STRENGTH:   Left/Right 07/08/2023 dynamometry Left 07/08/2023 dynamometry  Hip flexion    Hip extension    Hip abduction    Hip adduction    Hip internal rotation    Hip external rotation    Knee flexion    Knee extension 64.7 lbs 100 lbs   Ankle dorsiflexion    Ankle plantarflexion    Ankle inversion    Ankle eversion     (Blank rows = not tested)  GAIT: 07/19/2023: Independent ambulation c no observed deviations in clinic level surface ambulation.   07/08/2023 Distance walked: 100 feet Assistive device utilized: Single point cane Level of assistance: Complete Independence Comments: [name] is using the cane outside the house but he mentions he is not using any assistive device inside the house                   TREATMENT         DATE:   07/21/2023 Recumbent bike Seat 9 for 5 minutes Level 6 Resistance Knee extension machine 2 sets of 20 x 15# up bilateral, down right only with slow eccentric contraction Heel raises in door frame (up bilateral, down right only) 20 x   Functional Activities: Double Leg Press at 100#; 125# and 150# 5 reps each Single leg Press 75# 10 reps Step-down off 4, 6 and 8 inch step, no hands (if  possible) and slow eccentric 10 x each Dynamic heel to toe balance and on foam 2 laps each  Neuromuscular re-education: Tandem balance eyes open; head turning 1 x each; eyes closed and foam 2 x each 20 seconds Single leg balance eyes open; head turning; eyes closed; on foam 2 x 10 seconds each   07/19/2023 Therex: Recumbent bike Lvl 3 10 mins - seat 9  DF stretch on step with hands on rails 2-3 sec hold x 5 for Rt ankle DF mobility Incline gastroc stretch 30 sec x  3 bilateral  Standing lumbar extension instruction  Supine SKC 15 sec x 3 Rt  Supine figure 4 pull towards 15 sec x 5 Rt Supine bridge 2-3 sec hold x 10    Neuro Re-ed: SLS on foam with contralateral leg touching corners lightly x 8 each, performed bilaterally  SLS on foam with contralateral leg step over/back 8 inch hurdle x 15 bilateral  Fitter rocker board fwd/back light touching x 20, balance attempt 1 min in // bars with occasional HHA    TherActivity: (to improve stairs, squatting, transfers) Leg press double leg 100 lbs x 15, 125 lbs x15 , Rt leg x 15  50 lbs  (cues for gym use and setup) Lateral step down 4 inch step WB on Rt leg x 10, 6 inch x 2.   Step on over and down 4 inch step WB on Rt leg x 10   Vaso deferred by patient today   07/08/2023  Quadriceps sets with right heel prop 15 x 5 seconds Seated knee flexion active assisted range of motion (left pushes right into flexion) 10 x 10 seconds Seated straight leg raises 3 sets of 10 with 3 pound weights  Vaso right knee high pressure 40 degrees 10 minutes   PATIENT EDUCATION:  Education details: Reviewed exam findings and day 1 home exercises Person educated: Patient Education method: Explanation, Demonstration, Tactile cues, Verbal cues, and Handouts Education comprehension: verbalized understanding, returned demonstration, verbal cues required, tactile cues required, and needs further education  HOME EXERCISE PROGRAM: Access Code: [email or web address] URL:  [email or web address] Date: 07/19/2023 Prepared by: Ozell Silvan  Exercises - Supine Quadricep Sets  - 5 x daily - 7 x weekly - 2 sets - 10 reps - 5 second hold - Seated Knee Flexion AAROM  - 3 x daily - 7 x weekly - 1 sets - 10 reps - 10 seconds hold - Small Range Straight Leg Raise  - 1 x daily - 7 x weekly - 3-5 sets - 10 reps - 3 seconds hold - Lateral Step Down (Mirrored)  - 1 x daily - 3-4 x weekly - 1-2 sets - 10 reps - Standing Dorsiflexion Self-Mobilization on Step  - 1-2 x daily - 7 x weekly - 1 sets - 10 reps - 2-3 hold - Standing Bilateral Gastroc Stretch with Step  - 1-2 x daily - 7 x weekly - 1 sets - 3-5 reps - 30 hold - Supine Single Knee to Chest Stretch  - 2 x daily - 7 x weekly - 1 sets - 3-5 reps - 15 hold - Supine Piriformis Stretch with Foot on Ground  - 2 x daily - 7 x weekly - 1 sets - 5 reps - 30 hold - Supine Bridge  - 1-2 x daily - 7 x weekly - 1-2 sets - 10 reps - 2 hold  ASSESSMENT:  CLINICAL IMPRESSION: [name] is probably in the top 2% of the 4,000 or so total knee replacement patients I have seen over the past 27 years.  AROM is 0 - 3 - 122  degrees.  Strength is improving and he is doing an excellent job with balance, proprioceptive and functional activities.  Frank Clarke should meet long-term goals early and may be appropriate for transfer into independent rehabilitation earlier than expected.  OBJECTIVE IMPAIRMENTS: Abnormal gait, decreased activity tolerance, decreased endurance, decreased knowledge of condition, difficulty walking, decreased ROM, decreased strength, increased edema, and pain.   ACTIVITY LIMITATIONS: bending, standing, squatting, sleeping, stairs, and locomotion level  PARTICIPATION LIMITATIONS: driving, community activity, and occupation  PERSONAL FACTORS: OA, HTN, multiple knee arthroscopies, Right shoulder arthroscopy are also affecting patient's functional outcome.   REHAB POTENTIAL: Excellent  CLINICAL DECISION MAKING:  Stable/uncomplicated  EVALUATION COMPLEXITY: Low   GOALS: Goals reviewed with patient? Yes  SHORT TERM GOALS: Target date: 08/05/2023 [name] will be independent with his day 1 home exercise program Baseline: Started 07/08/2023 Goal status: Met 07/21/2023  2.  Improve right knee extension active range of motion to within 3 degrees of neutral Baseline: Within 5 degrees of neutral Goal status: Met 07/21/2023  3.  Improve right knee flexion active range of motion to at least 120 degrees Baseline: 115 degrees Goal status: Met 07/21/2023   LONG TERM GOALS: Target date: 09/02/2023  Improve METS patient specific functional score to at least 7.75 Baseline: 5.75 Goal status: INITIAL  2.  [name] will report right knee pain consistently 0-3/10 on the visual analog scale Baseline: 2-5/10 Goal status: On Going 07/21/2023  3.  [name] will have at least 80 pounds of right quadriceps strength as assessed by hand-held dynamometer Baseline: 64.7 pounds Goal status: INITIAL  4.  [name] will be able to walk enough to perform his essential job duties and basic ADLs without an assistive device or increasing pain Baseline: Desk duty at work and using a cane outside the home Goal status: INITIAL  5.  [name] will be independent with his long-term home maintenance exercise program at discharge Baseline: Started 07/08/2023 Goal status: INITIAL  PLAN:  PT FREQUENCY: 2x/week  PT DURATION: 8 weeks  PLANNED INTERVENTIONS: 97750- Physical Performance Testing, 97110-Therapeutic exercises, 97530- Therapeutic activity, 97112- Neuromuscular re-education, 97535- Self Care, 02859- Manual therapy, [phone]- Gait training, [phone]- Electrical stimulation (unattended), 97016- Vasopneumatic device, Patient/Family education, Balance training, Stair training, Joint mobilization, and Cryotherapy  PLAN FOR NEXT SESSION: Strength, balance and functional emphasis for likely early transition to independent PT.  Myer LELON Ivory PT,  MPT 07/21/23  11:47 AM

## 2023-07-27 ENCOUNTER — Encounter: Payer: Self-pay | Admitting: Physical Therapy

## 2023-07-27 ENCOUNTER — Ambulatory Visit (INDEPENDENT_AMBULATORY_CARE_PROVIDER_SITE_OTHER): Admitting: Physical Therapy

## 2023-07-27 DIAGNOSIS — M6281 Muscle weakness (generalized): Secondary | ICD-10-CM | POA: Diagnosis not present

## 2023-07-27 DIAGNOSIS — R600 Localized edema: Secondary | ICD-10-CM | POA: Diagnosis not present

## 2023-07-27 DIAGNOSIS — R262 Difficulty in walking, not elsewhere classified: Secondary | ICD-10-CM | POA: Diagnosis not present

## 2023-07-27 DIAGNOSIS — M25561 Pain in right knee: Secondary | ICD-10-CM

## 2023-07-27 DIAGNOSIS — M25661 Stiffness of right knee, not elsewhere classified: Secondary | ICD-10-CM | POA: Diagnosis not present

## 2023-07-27 NOTE — Therapy (Signed)
 OUTPATIENT PHYSICAL THERAPY TREATMENT   Patient Name: Frank Clarke MRN: [id number] DOB:December 12, 1980, 43 y.o., male Today's Date: 07/27/2023  END OF SESSION:  PT End of Session - 07/27/23 0930     Visit Number 4    Number of Visits 16    Date for PT Re-Evaluation 09/02/23    Authorization Type UHC    Progress Note Due on Visit 10    PT Start Time 0930    PT Stop Time 1010    PT Time Calculation (min) 40 min    Activity Tolerance Patient tolerated treatment well;No increased pain    Behavior During Therapy [facility] for tasks assessed/performed          Past Medical History:  Diagnosis Date   Arthritis    Dental crowns present    Heart murmur    Hypertension    Irritable bowel    Perianal abscess 10/2016   Sinus congestion 10/29/2016   Sleep apnea    cpap   Past Surgical History:  Procedure Laterality Date   ASD REPAIR     age 87   CHOLECYSTECTOMY     EVALUATION UNDER ANESTHESIA WITH FISTULECTOMY Left 11/04/2016   Procedure: EXAM UNDER ANESTHESIA WITH FISTULOTOMY;  Surgeon: Vernetta Berg, MD;  Location: [facility];  Service: General;  Laterality: Left;   KNEE ARTHROSCOPY Right    x 2   KNEE ARTHROSCOPY Left    SHOULDER ARTHROSCOPY Right    TOTAL KNEE ARTHROPLASTY Right 06/18/2023   Procedure: ARTHROPLASTY, KNEE, TOTAL;  Surgeon: Vernetta Lonni GRADE, MD;  Location: [facility];  Service: Orthopedics;  Laterality: Right;   Patient Active Problem List   Diagnosis Date Noted   Status post total right knee replacement 07/01/2023   Unilateral primary osteoarthritis, right knee 06/17/2023    PCP: Camie Franchot Mirza, PA-C  REFERRING PROVIDER: Lonni GRADE Vernetta, MD  REFERRING DIAG: [phone] (ICD-10-CM) - Status post total right knee replacement  THERAPY DIAG:  Difficulty in walking, not elsewhere classified  Muscle weakness (generalized)  Localized edema  Stiffness of right knee, not elsewhere classified  Acute pain of right  knee  Rationale for Evaluation and Treatment: Rehabilitation  ONSET DATE: Jun 18, 2023 Rt TKA  SUBJECTIVE:   SUBJECTIVE STATEMENT: Pt reports he took himself off gabapentin . Had poor sleep last night. Woke up and could feel the burning sensation down his leg. Has been able to sleep in the bed otherwise now.   PERTINENT HISTORY: OA, HTN, multiple knee arthroscopies, Right shoulder arthroscopy  PAIN:  NPRS scale: 2 currently/10  Pain location: Rt knee Pain description: Achy, sore, stiff Aggravating factors: Later in the day and at night Relieving factors: Tylenol  during the day every 6 hours and 300 mg Gabapentin  1 x a day  PRECAUTIONS: None and Other: Left knee arthritis  RED FLAGS: None   WEIGHT BEARING RESTRICTIONS: No  FALLS:  Has patient fallen in last 6 months? No  LIVING ENVIRONMENT: Lives with: lives with their family, lives with their spouse, and lives with their daughter Lives in: House/apartment Stairs: Uses handrail and /or a cane Has following equipment at home: Single point cane  OCCUPATION: Scientist, forensic  PLOF: Independent  PATIENT GOALS: Get back to normal driving, work and functional activities  NEXT MD VISIT: 07/29/2023  OBJECTIVE:  Note: Objective measures were completed at Evaluation unless otherwise noted.  DIAGNOSTIC FINDINGS:  2 views of the left knee show significant arthritis of the patellofemoral  joint and osteophytes in all 3  compartments.   2 views of the right knee show significant tricompartment arthritis  especially at the patellofemoral joint.  There are osteophytes in all 3  compartments.  PATIENT SURVEYS:  Patient-Specific Activity Scoring Scheme  0 represents "unable to perform." 10 represents "able to perform at prior level. 0 1 2 3 4 5 6 7 8 9  10 (Date and Score)   Activity Eval  06/24/17/202530/2025    1.  Walking 5/10    2.  Descending stairs 5/10    3.  Sleeping in a bed 5/10   4.  Sitting in a chair  8/10   5.    Score 5.75    Total score = sum of the activity scores/number of activities Minimum detectable change (90%CI) for average score = 2 points Minimum detectable change (90%CI) for single activity score = 3 points     COGNITION: 07/08/2023 Overall cognitive status: Within functional limits for tasks assessed     SENSATION: 07/08/2023 [facility]  EDEMA:  07/08/2023 Noted and not objectively assessed   LOWER EXTREMITY ROM:  Active ROM Right 07/08/2023 Left 07/08/2023 Right 07/21/2023  Hip flexion     Hip extension     Hip abduction     Hip adduction     Hip internal rotation     Hip external rotation     Knee flexion 115 133 122  Knee extension -5 0 -3  Ankle dorsiflexion     Ankle plantarflexion     Ankle inversion     Ankle eversion      (Blank rows = not tested)  LOWER EXTREMITY STRENGTH:   Left/Right 07/08/2023 dynamometry Left 07/08/2023 dynamometry  Hip flexion    Hip extension    Hip abduction    Hip adduction    Hip internal rotation    Hip external rotation    Knee flexion    Knee extension 64.7 lbs 100 lbs   Ankle dorsiflexion    Ankle plantarflexion    Ankle inversion    Ankle eversion     (Blank rows = not tested)  GAIT: 07/19/2023: Independent ambulation c no observed deviations in clinic level surface ambulation.   07/08/2023 Distance walked: 100 feet Assistive device utilized: Single point cane Level of assistance: Complete Independence Comments: [name] is using the cane outside the house but he mentions he is not using any assistive device inside the house                  TREATMENT         DATE:   07/27/2023 Recumbent bike Seat 9 for 6 minutes Level 6 Resistance Standing gastroc stretch on slant board x30 Standing soleus stretch on slant board x 30 Standing hamstring stretch x 30 Seated figure 4 stretch x 30 Seated piriformis stretch x 30 Double leg press at 100# x10, 150# 2x10 Single leg press 75# 2x10 Standing balance on airex  tap fwd, side, bwd 2x10 Fwd step up on airex and 6 step 2x10 Eccentric side step down 6 step no UE support 2x10 Eccentric fwd step down 4 step no UE support 2x10 Dynamic stretching: butt kicks 2x25', high kicks 2x25' [place] 4x25' Fwd step over hurdles, side step over hurdles, backwards stepping over hurdles   TREATMENT         DATE:   07/21/2023 Recumbent bike Seat 9 for 5 minutes Level 6 Resistance Knee extension machine 2 sets of 20 x 15# up bilateral, down right only with slow eccentric contraction Heel  raises in door frame (up bilateral, down right only) 20 x   Functional Activities: Double Leg Press at 100#; 125# and 150# 5 reps each Single leg Press 75# 10 reps Step-down off 4, 6 and 8 inch step, no hands (if possible) and slow eccentric 10 x each Dynamic heel to toe balance and on foam 2 laps each  Neuromuscular re-education: Tandem balance eyes open; head turning 1 x each; eyes closed and foam 2 x each 20 seconds Single leg balance eyes open; head turning; eyes closed; on foam 2 x 10 seconds each   07/19/2023 Therex: Recumbent bike Lvl 3 10 mins - seat 9  DF stretch on step with hands on rails 2-3 sec hold x 5 for Rt ankle DF mobility Incline gastroc stretch 30 sec x 3 bilateral  Standing lumbar extension instruction  Supine SKC 15 sec x 3 Rt  Supine figure 4 pull towards 15 sec x 5 Rt Supine bridge 2-3 sec hold x 10    Neuro Re-ed: SLS on foam with contralateral leg touching corners lightly x 8 each, performed bilaterally  SLS on foam with contralateral leg step over/back 8 inch hurdle x 15 bilateral  Fitter rocker board fwd/back light touching x 20, balance attempt 1 min in // bars with occasional HHA    TherActivity: (to improve stairs, squatting, transfers) Leg press double leg 100 lbs x 15, 125 lbs x15 , Rt leg x 15  50 lbs  (cues for gym use and setup) Lateral step down 4 inch step WB on Rt leg x 10, 6 inch x 2.   Step on over and down 4 inch step WB on  Rt leg x 10   Vaso deferred by patient today   07/08/2023  Quadriceps sets with right heel prop 15 x 5 seconds Seated knee flexion active assisted range of motion (left pushes right into flexion) 10 x 10 seconds Seated straight leg raises 3 sets of 10 with 3 pound weights  Vaso right knee high pressure 40 degrees 10 minutes   PATIENT EDUCATION:  Education details: Reviewed exam findings and day 1 home exercises Person educated: Patient Education method: Explanation, Demonstration, Tactile cues, Verbal cues, and Handouts Education comprehension: verbalized understanding, returned demonstration, verbal cues required, tactile cues required, and needs further education  HOME EXERCISE PROGRAM: Access Code: [email or web address] URL: [email or web address] Date: 07/19/2023 Prepared by: Ozell Silvan  Exercises - Supine Quadricep Sets  - 5 x daily - 7 x weekly - 2 sets - 10 reps - 5 second hold - Seated Knee Flexion AAROM  - 3 x daily - 7 x weekly - 1 sets - 10 reps - 10 seconds hold - Small Range Straight Leg Raise  - 1 x daily - 7 x weekly - 3-5 sets - 10 reps - 3 seconds hold - Lateral Step Down (Mirrored)  - 1 x daily - 3-4 x weekly - 1-2 sets - 10 reps - Standing Dorsiflexion Self-Mobilization on Step  - 1-2 x daily - 7 x weekly - 1 sets - 10 reps - 2-3 hold - Standing Bilateral Gastroc Stretch with Step  - 1-2 x daily - 7 x weekly - 1 sets - 3-5 reps - 30 hold - Supine Single Knee to Chest Stretch  - 2 x daily - 7 x weekly - 1 sets - 3-5 reps - 15 hold - Supine Piriformis Stretch with Foot on Ground  - 2 x daily - 7 x weekly - 1 sets - 5 reps -  30 hold - Supine Bridge  - 1-2 x daily - 7 x weekly - 1-2 sets - 10 reps - 2 hold  ASSESSMENT:  CLINICAL IMPRESSION: Pt continues to do well. Mostly bothered by nerve pain when he tried to come off gabapentin . Provided stretches for posterior LE for his sciatic nerve. Continued gross R LE strengthening. Worked on static and dynamic  balance/stability today with obstacles, stepping in various directions and stabilizing on unstable surfaces. Pt tolerated well. Some compensatory motions noted with forward stepping over hurdles. Compensates with hip drop during eccentric step downs.   OBJECTIVE IMPAIRMENTS: Abnormal gait, decreased activity tolerance, decreased endurance, decreased knowledge of condition, difficulty walking, decreased ROM, decreased strength, increased edema, and pain.   ACTIVITY LIMITATIONS: bending, standing, squatting, sleeping, stairs, and locomotion level  PARTICIPATION LIMITATIONS: driving, community activity, and occupation  PERSONAL FACTORS: OA, HTN, multiple knee arthroscopies, Right shoulder arthroscopy are also affecting patient's functional outcome.   REHAB POTENTIAL: Excellent  CLINICAL DECISION MAKING: Stable/uncomplicated  EVALUATION COMPLEXITY: Low   GOALS: Goals reviewed with patient? Yes  SHORT TERM GOALS: Target date: 08/05/2023 [name] will be independent with his day 1 home exercise program Baseline: Started 07/08/2023 Goal status: Met 07/21/2023  2.  Improve right knee extension active range of motion to within 3 degrees of neutral Baseline: Within 5 degrees of neutral Goal status: Met 07/21/2023  3.  Improve right knee flexion active range of motion to at least 120 degrees Baseline: 115 degrees Goal status: Met 07/21/2023   LONG TERM GOALS: Target date: 09/02/2023  Improve METS patient specific functional score to at least 7.75 Baseline: 5.75 Goal status: INITIAL  2.  [name] will report right knee pain consistently 0-3/10 on the visual analog scale Baseline: 2-5/10 Goal status: On Going 07/21/2023  3.  [name] will have at least 80 pounds of right quadriceps strength as assessed by hand-held dynamometer Baseline: 64.7 pounds Goal status: INITIAL  4.  [name] will be able to walk enough to perform his essential job duties and basic ADLs without an assistive device or increasing  pain Baseline: Desk duty at work and using a cane outside the home Goal status: INITIAL  5.  [name] will be independent with his long-term home maintenance exercise program at discharge Baseline: Started 07/08/2023 Goal status: INITIAL  PLAN:  PT FREQUENCY: 2x/week  PT DURATION: 8 weeks  PLANNED INTERVENTIONS: 97750- Physical Performance Testing, 97110-Therapeutic exercises, 97530- Therapeutic activity, 97112- Neuromuscular re-education, 97535- Self Care, 02859- Manual therapy, [phone]- Gait training, [phone]- Electrical stimulation (unattended), 97016- Vasopneumatic device, Patient/Family education, Balance training, Stair training, Joint mobilization, and Cryotherapy  PLAN FOR NEXT SESSION: Strength, balance and functional emphasis for likely early transition to independent PT.  Lavaughn Bisig April Ma L Jaaziel Peatross, PT, DPT 07/27/23  9:30 AM

## 2023-07-29 ENCOUNTER — Ambulatory Visit (INDEPENDENT_AMBULATORY_CARE_PROVIDER_SITE_OTHER): Admitting: Orthopaedic Surgery

## 2023-07-29 ENCOUNTER — Encounter: Payer: Self-pay | Admitting: Orthopaedic Surgery

## 2023-07-29 ENCOUNTER — Encounter: Payer: Self-pay | Admitting: Rehabilitative and Restorative Service Providers"

## 2023-07-29 ENCOUNTER — Ambulatory Visit: Admitting: Rehabilitative and Restorative Service Providers"

## 2023-07-29 DIAGNOSIS — M25661 Stiffness of right knee, not elsewhere classified: Secondary | ICD-10-CM

## 2023-07-29 DIAGNOSIS — M6281 Muscle weakness (generalized): Secondary | ICD-10-CM | POA: Diagnosis not present

## 2023-07-29 DIAGNOSIS — R262 Difficulty in walking, not elsewhere classified: Secondary | ICD-10-CM

## 2023-07-29 DIAGNOSIS — Z96651 Presence of right artificial knee joint: Secondary | ICD-10-CM

## 2023-07-29 DIAGNOSIS — M25561 Pain in right knee: Secondary | ICD-10-CM

## 2023-07-29 DIAGNOSIS — R600 Localized edema: Secondary | ICD-10-CM

## 2023-07-29 NOTE — Therapy (Signed)
 OUTPATIENT PHYSICAL THERAPY TREATMENT   Patient Name: Frank Clarke MRN: [id number] DOB:11-03-1980, 43 y.o., male Today's Date: 07/29/2023  END OF SESSION:  PT End of Session - 07/29/23 1143     Visit Number 5    Number of Visits 16    Date for PT Re-Evaluation 09/02/23    Authorization Type [employer]    Progress Note Due on Visit 10    PT Start Time [phone]    PT Stop Time 1021    PT Time Calculation (min) 50 min    Activity Tolerance Patient tolerated treatment well;No increased pain    Behavior During Therapy [facility] for tasks assessed/performed           Past Medical History:  Diagnosis Date   Arthritis    Dental crowns present    Heart murmur    Hypertension    Irritable bowel    Perianal abscess 10/2016   Sinus congestion 10/29/2016   Sleep apnea    cpap   Past Surgical History:  Procedure Laterality Date   ASD REPAIR     age 61   CHOLECYSTECTOMY     EVALUATION UNDER ANESTHESIA WITH FISTULECTOMY Left 11/04/2016   Procedure: EXAM UNDER ANESTHESIA WITH FISTULOTOMY;  Surgeon: Clarke Berg, MD;  Location: [facility];  Service: General;  Laterality: Left;   KNEE ARTHROSCOPY Right    x 2   KNEE ARTHROSCOPY Left    SHOULDER ARTHROSCOPY Right    TOTAL KNEE ARTHROPLASTY Right 06/18/2023   Procedure: ARTHROPLASTY, KNEE, TOTAL;  Surgeon: Clarke Frank GRADE, MD;  Location: [facility];  Service: Orthopedics;  Laterality: Right;   Patient Active Problem List   Diagnosis Date Noted   Unilateral primary osteoarthritis, right knee 06/17/2023    PCP: Frank Franchot Mirza, PA-C  REFERRING PROVIDER: Lonni Clarke Vernetta, MD  REFERRING DIAG: [phone] (ICD-10-CM) - Status post total right knee replacement  THERAPY DIAG:  Difficulty in walking, not elsewhere classified  Muscle weakness (generalized)  Localized edema  Stiffness of right knee, not elsewhere classified  Acute pain of right knee  Rationale for Evaluation and Treatment:  Rehabilitation  ONSET DATE: Jun 18, 2023 Rt TKA  SUBJECTIVE:   SUBJECTIVE STATEMENT: Frank Clarke continues to excel with his early postsurgical rehabilitation.  Home exercise program compliance and overall function are excellent for this point post-surgery.  PERTINENT HISTORY: OA, HTN, multiple knee arthroscopies, Right shoulder arthroscopy  PAIN:  NPRS scale: 0-3/10 this week Pain location: Rt knee Pain description: Achy, sore, stiff Aggravating factors: Later in the day and at night, overuse Relieving factors: Tylenol  during the day every 6 hours (more PRN at this point) and 300 mg Gabapentin  1 x a day  PRECAUTIONS: None and Other: Left knee arthritis  RED FLAGS: None   WEIGHT BEARING RESTRICTIONS: No  FALLS:  Has patient fallen in last 6 months? No  LIVING ENVIRONMENT: Lives with: lives with their family, lives with their spouse, and lives with their daughter Lives in: House/apartment Stairs: Uses handrail and /or a cane Has following equipment at home: Single point cane  OCCUPATION: Scientist, forensic  PLOF: Independent  PATIENT GOALS: Get back to normal driving, work and functional activities  NEXT MD VISIT: 07/29/2023  OBJECTIVE:  Note: Objective measures were completed at Evaluation unless otherwise noted.  DIAGNOSTIC FINDINGS:  2 views of the left knee show significant arthritis of the patellofemoral  joint and osteophytes in all 3 compartments.   2 views of the right knee show significant tricompartment arthritis  especially at the patellofemoral joint.  There are osteophytes in all 3  compartments.  PATIENT SURVEYS:  Patient-Specific Activity Scoring Scheme  0 represents "unable to perform." 10 represents "able to perform at prior level. 0 1 2 3 4 5 6 7 8 9  10 (Date and Score)   Activity Eval  6/19/202530/2025  07/29/2023  1.  Walking 5/10  9/10  2.  Descending stairs 5/10  7/10  3.  Sleeping in a bed 5/10 7/10  4.  Sitting in a chair 8/10 10/10   5.    Score 5.75 8.25   Total score = sum of the activity scores/number of activities Minimum detectable change (90%CI) for average score = 2 points Minimum detectable change (90%CI) for single activity score = 3 points     COGNITION: 07/08/2023 Overall cognitive status: Within functional limits for tasks assessed     SENSATION: 07/08/2023 [facility]  EDEMA:  07/08/2023 Noted and not objectively assessed   LOWER EXTREMITY ROM:  Active ROM Right 07/08/2023 Left 07/08/2023 Right 07/21/2023 Left/Right 07/29/2023  Hip flexion      Hip extension      Hip abduction      Hip adduction      Hip internal rotation      Hip external rotation      Knee flexion 115 133 122 139/127  Knee extension -5 0 -3 0/-3  Ankle dorsiflexion      Ankle plantarflexion      Ankle inversion      Ankle eversion       (Blank rows = not tested)  LOWER EXTREMITY STRENGTH:   Left/Right 07/08/2023 dynamometry Left 07/08/2023 dynamometry Right 07/29/2023  Hip flexion     Hip extension     Hip abduction     Hip adduction     Hip internal rotation     Hip external rotation     Knee flexion     Knee extension 64.7 lbs 100 lbs  Deferred  Ankle dorsiflexion     Ankle plantarflexion     Ankle inversion     Ankle eversion      (Blank rows = not tested)  GAIT: 07/19/2023: Independent ambulation c no observed deviations in clinic level surface ambulation.   07/08/2023 Distance walked: 100 feet Assistive device utilized: Single point cane Level of assistance: Complete Independence Comments: Frank Clarke is using the cane outside the house but he mentions he is not using any assistive device inside the house                  TREATMENT         DATE:   07/29/2023 Recumbent bike Seat 10 for 8 minutes Resistance Level 6 Seated straight leg raises 3 sets of 10 with 5# Prone knee extension stretch 2# for 3 minutes  Functional Activities: Double Leg Press 2 sets of 10 with 150# slow eccentrics Single leg Press 2 sets  of 10 with 100# slow eccentrics  Vaso Rt knee 10 minutes Medium 34*   07/27/2023 Recumbent bike Seat 9 for 6 minutes Level 6 Resistance Standing gastroc stretch on slant board x30 Standing soleus stretch on slant board x 30 Standing hamstring stretch x 30 Seated figure 4 stretch x 30 Seated piriformis stretch x 30 Double leg press at 100# x10, 150# 2x10 Single leg press 75# 2x10 Standing balance on airex tap fwd, side, bwd 2x10 Fwd step up on airex and 6 step 2x10 Eccentric side step down 6 step no UE  support 2x10 Eccentric fwd step down 4 step no UE support 2x10 Dynamic stretching: butt kicks 2x25', high kicks 2x25' [place] 4x25' Fwd step over hurdles, side step over hurdles, backwards stepping over hurdles   07/21/2023 Recumbent bike Seat 9 for 5 minutes Level 6 Resistance Knee extension machine 2 sets of 20 x 15# up bilateral, down right only with slow eccentric contraction Heel raises in door frame (up bilateral, down right only) 20 x   Functional Activities: Double Leg Press at 100#; 125# and 150# 5 reps each Single leg Press 75# 10 reps Step-down off 4, 6 and 8 inch step, no hands (if possible) and slow eccentric 10 x each Dynamic heel to toe balance and on foam 2 laps each  Neuromuscular re-education: Tandem balance eyes open; head turning 1 x each; eyes closed and foam 2 x each 20 seconds Single leg balance eyes open; head turning; eyes closed; on foam 2 x 10 seconds each   PATIENT EDUCATION:  Education details: Reviewed exam findings and day 1 home exercises Person educated: Patient Education method: Explanation, Demonstration, Tactile cues, Verbal cues, and Handouts Education comprehension: verbalized understanding, returned demonstration, verbal cues required, tactile cues required, and needs further education  HOME EXERCISE PROGRAM: Access Code: [email or web address] URL: [email or web address] Date: 07/29/2023 Prepared by: Lamar Ivory  Exercises - Supine Quadricep Sets  - 5 x daily - 7 x weekly - 2 sets - 10 reps - 5 second hold - Seated Knee Flexion AAROM  - 3 x daily - 7 x weekly - 1 sets - 10 reps - 10 seconds hold - Seated Straight Leg Raise  - 1 x daily - 7 x weekly - 3-5 sets - 10 reps - 3 seconds hold - Lateral Step Down (Mirrored)  - 1 x daily - 3-4 x weekly - 1-2 sets - 10 reps - Standing Dorsiflexion Self-Mobilization on Step  - 1-2 x daily - 7 x weekly - 1 sets - 10 reps - 2-3 hold - Standing Bilateral Gastroc Stretch with Step  - 1-2 x daily - 7 x weekly - 1 sets - 3-5 reps - 30 hold - Supine Single Knee to Chest Stretch  - 2 x daily - 7 x weekly - 1 sets - 3-5 reps - 15 hold - Supine Piriformis Stretch with Foot on Ground  - 2 x daily - 7 x weekly - 1 sets - 5 reps - 30 hold - Supine Bridge  - 1-2 x daily - 7 x weekly - 1-2 sets - 10 reps - 2 hold  ASSESSMENT:  CLINICAL IMPRESSION: AROM was assessed at 0 - 3 - 127 degrees today.  Frank Clarke is doing an excellent job with his early supervised physical therapy.  Frank Clarke is the rare patient that I have to slow him down a bit as he does have a tendency to overdo things slightly, although not at a dangerous level.  He does sometimes cause some self more aggravation and is necessary due to his excellent work ethic and desire to get well soon.  We discussed the importance of returning full knee extension active range of motion and we discussed implementing the prone knee extension stretch into his home program to help achieve this.  [name] would like to continue supervised physical therapy a bit longer to make sure he gets his full knee extension and that he feels comfortable returning to his normal functional activities before discharge from supervised physical therapy.  OBJECTIVE IMPAIRMENTS: Abnormal gait, decreased activity tolerance, decreased  endurance, decreased knowledge of condition, difficulty walking, decreased ROM, decreased strength, increased edema, and pain.    ACTIVITY LIMITATIONS: bending, standing, squatting, sleeping, stairs, and locomotion level  PARTICIPATION LIMITATIONS: driving, community activity, and occupation  PERSONAL FACTORS: OA, HTN, multiple knee arthroscopies, Right shoulder arthroscopy are also affecting patient's functional outcome.   REHAB POTENTIAL: Excellent  CLINICAL DECISION MAKING: Stable/uncomplicated  EVALUATION COMPLEXITY: Low   GOALS: Goals reviewed with patient? Yes  SHORT TERM GOALS: Target date: 08/05/2023 Frank Clarke will be independent with his day 1 home exercise program Baseline: Started 07/08/2023 Goal status: Met 07/21/2023  2.  Improve right knee extension active range of motion to within 3 degrees of neutral Baseline: Within 5 degrees of neutral Goal status: Met 07/21/2023  3.  Improve right knee flexion active range of motion to at least 120 degrees Baseline: 115 degrees Goal status: Met 07/21/2023   LONG TERM GOALS: Target date: 09/02/2023  Improve METS patient specific functional score to at least 7.75 Baseline: 5.75 Goal status: Met 07/29/2023  2.  Frank Clarke will report right knee pain consistently 0-3/10 on the visual analog scale Baseline: 2-5/10 Goal status: Met 07/29/2023  3.  Frank Clarke will have at least 80 pounds of right quadriceps strength as assessed by hand-held dynamometer Baseline: 64.7 pounds Goal status: Ongoing 07/29/2023  4.  Frank Clarke will be able to walk enough to perform his essential job duties and basic ADLs without an assistive device or increasing pain Baseline: Desk duty at work and using a cane outside the home Goal status: Ongoing 07/29/2023  5.  Frank Clarke will be independent with his long-term home maintenance exercise program at discharge Baseline: Started 07/08/2023 Goal status: Ongoing 07/29/2023  PLAN:  PT FREQUENCY:  1- 2 x a week  PT DURATION: Up to 09/02/2023  PLANNED INTERVENTIONS: 97750- Physical Performance Testing, 97110-Therapeutic exercises, 97530- Therapeutic activity,  97112- Neuromuscular re-education, 97535- Self Care, 02859- Manual therapy, [phone]- Gait training, [phone]- Electrical stimulation (unattended), 97016- Vasopneumatic device, Patient/Family education, Balance training, Stair training, Joint mobilization, and Cryotherapy  PLAN FOR NEXT SESSION: Knee extension active range of motion, Quadriceps strength, balance and functional emphasis for likely early transition to independent PT.  Myer LELON Ivory, PT, MPT 07/29/23  5:46 PM

## 2023-07-29 NOTE — Progress Notes (Signed)
 The patient is 43 year old who is male 6-week status post a right total knee replacement.  He is way far ahead of what most people are at this week's week visit.  He is taking some gabapentin  and occasional Tylenol .  His range of motion is what is excellent.  Exam the knee is still warm and swollen to be expected but is not a lot of swelling.  His extension is full and his flexion is full.  The knee feels ligamentously stable.  He will still go slow with his activities as he strengthening his knee.  He is someone who is in [employer] and needs to be at 100% before being fully released.  From my standpoint we will see him back in 3 months with a standing AP and lateral of his right operative knee.  If there are issues before then he knows to reach out and let us  know.

## 2023-08-03 ENCOUNTER — Encounter: Admitting: Physical Therapy

## 2023-08-05 ENCOUNTER — Ambulatory Visit (INDEPENDENT_AMBULATORY_CARE_PROVIDER_SITE_OTHER): Admitting: Rehabilitative and Restorative Service Providers"

## 2023-08-05 ENCOUNTER — Encounter: Payer: Self-pay | Admitting: Rehabilitative and Restorative Service Providers"

## 2023-08-05 DIAGNOSIS — R600 Localized edema: Secondary | ICD-10-CM

## 2023-08-05 DIAGNOSIS — M6281 Muscle weakness (generalized): Secondary | ICD-10-CM

## 2023-08-05 DIAGNOSIS — M25561 Pain in right knee: Secondary | ICD-10-CM

## 2023-08-05 DIAGNOSIS — M25661 Stiffness of right knee, not elsewhere classified: Secondary | ICD-10-CM

## 2023-08-05 DIAGNOSIS — R262 Difficulty in walking, not elsewhere classified: Secondary | ICD-10-CM | POA: Diagnosis not present

## 2023-08-05 NOTE — Therapy (Signed)
 OUTPATIENT PHYSICAL THERAPY TREATMENT   Patient Name: Frank Clarke MRN: [id number] DOB:01-04-1981, 43 y.o., male Today's Date: 08/05/2023  END OF SESSION:  PT End of Session - 08/05/23 0844     Visit Number 6    Number of Visits 16    Date for PT Re-Evaluation 09/02/23    Authorization Type [employer]    Progress Note Due on Visit 10    PT Start Time 0844    PT Stop Time 0926    PT Time Calculation (min) 42 min    Activity Tolerance Patient tolerated treatment well;No increased pain;Patient limited by pain    Behavior During Therapy [facility] for tasks assessed/performed            Past Medical History:  Diagnosis Date   Arthritis    Dental crowns present    Heart murmur    Hypertension    Irritable bowel    Perianal abscess 10/2016   Sinus congestion 10/29/2016   Sleep apnea    cpap   Past Surgical History:  Procedure Laterality Date   ASD REPAIR     age 71   CHOLECYSTECTOMY     EVALUATION UNDER ANESTHESIA WITH FISTULECTOMY Left 11/04/2016   Procedure: EXAM UNDER ANESTHESIA WITH FISTULOTOMY;  Surgeon: Vernetta Berg, MD;  Location: [facility];  Service: General;  Laterality: Left;   KNEE ARTHROSCOPY Right    x 2   KNEE ARTHROSCOPY Left    SHOULDER ARTHROSCOPY Right    TOTAL KNEE ARTHROPLASTY Right 06/18/2023   Procedure: ARTHROPLASTY, KNEE, TOTAL;  Surgeon: Vernetta Lonni GRADE, MD;  Location: [facility];  Service: Orthopedics;  Laterality: Right;   Patient Active Problem List   Diagnosis Date Noted   Unilateral primary osteoarthritis, right knee 06/17/2023    PCP: Camie Franchot Mirza, PA-C  REFERRING PROVIDER: Lonni GRADE Vernetta, MD  REFERRING DIAG: [phone] (ICD-10-CM) - Status post total right knee replacement  THERAPY DIAG:  Difficulty in walking, not elsewhere classified  Muscle weakness (generalized)  Localized edema  Stiffness of right knee, not elsewhere classified  Acute pain of right knee  Rationale for Evaluation and  Treatment: Rehabilitation  ONSET DATE: Jun 18, 2023 Rt TKA  SUBJECTIVE:   SUBJECTIVE STATEMENT: Frank Clarke had a positive follow-up with Dr. Vernetta.  He should be released at that visit.  We discussed possibly returning to some sprinting drills at month 4 post-surgery.  PERTINENT HISTORY: OA, HTN, multiple knee arthroscopies, Right shoulder arthroscopy  PAIN:  NPRS scale: 0-4/10 this week (with overdoing stairs) Pain location: Rt knee Pain description: Achy, sore, stiff Aggravating factors: Later in the day and at night, overuse Relieving factors: Tylenol  during the day every 6 hours (more PRN at this point) and 300 mg Gabapentin  1 x a day  PRECAUTIONS: None and Other: Left knee arthritis  RED FLAGS: None   WEIGHT BEARING RESTRICTIONS: No  FALLS:  Has patient fallen in last 6 months? No  LIVING ENVIRONMENT: Lives with: lives with their family, lives with their spouse, and lives with their daughter Lives in: House/apartment Stairs: Uses handrail and /or a cane Has following equipment at home: Single point cane  OCCUPATION: Scientist, forensic  PLOF: Independent  PATIENT GOALS: Get back to normal driving, work and functional activities  NEXT MD VISIT: 11/04/2023  OBJECTIVE:  Note: Objective measures were completed at Evaluation unless otherwise noted.  DIAGNOSTIC FINDINGS:  2 views of the left knee show significant arthritis of the patellofemoral  joint and osteophytes in all 3  compartments.   2 views of the right knee show significant tricompartment arthritis  especially at the patellofemoral joint.  There are osteophytes in all 3  compartments.  PATIENT SURVEYS:  Patient-Specific Activity Scoring Scheme  0 represents "unable to perform." 10 represents "able to perform at prior level. 0 1 2 3 4 5 6 7 8 9  10 (Date and Score)   Activity Eval  6/19/202530/2025  07/29/2023  1.  Walking 5/10  9/10  2.  Descending stairs 5/10  7/10  3.  Sleeping in a bed 5/10  7/10  4.  Sitting in a chair 8/10 10/10  5.    Score 5.75 8.25   Total score = sum of the activity scores/number of activities Minimum detectable change (90%CI) for average score = 2 points Minimum detectable change (90%CI) for single activity score = 3 points     COGNITION: 07/08/2023 Overall cognitive status: Within functional limits for tasks assessed     SENSATION: 07/08/2023 [facility]  EDEMA:  07/08/2023 Noted and not objectively assessed   LOWER EXTREMITY ROM:  Active ROM Right 07/08/2023 Left 07/08/2023 Right 07/21/2023 Left/Right 07/29/2023 Right 08/05/2023  Hip flexion       Hip extension       Hip abduction       Hip adduction       Hip internal rotation       Hip external rotation       Knee flexion 115 133 122 139/127   Knee extension -5 0 -3 0/-3 -2  Ankle dorsiflexion       Ankle plantarflexion       Ankle inversion       Ankle eversion        (Blank rows = not tested)  LOWER EXTREMITY STRENGTH:   Left/Right 07/08/2023 dynamometry Left 07/08/2023 dynamometry Right 07/29/2023  Hip flexion     Hip extension     Hip abduction     Hip adduction     Hip internal rotation     Hip external rotation     Knee flexion     Knee extension 64.7 lbs 100 lbs  Deferred  Ankle dorsiflexion     Ankle plantarflexion     Ankle inversion     Ankle eversion      (Blank rows = not tested)  GAIT: 07/19/2023: Independent ambulation c no observed deviations in clinic level surface ambulation.   07/08/2023 Distance walked: 100 feet Assistive device utilized: Single point cane Level of assistance: Complete Independence Comments: Frank Clarke is using the cane outside the house but he mentions he is not using any assistive device inside the house                  TREATMENT         DATE:   08/05/2023 Recumbent bike 9 Seat 4 Resistance 5-6 for 5 minutes Seated straight leg raises 4 sets of 10 with 5# Prone knee extension stretch 2# for 3 minutes Prone quadriceps stretch 5 x 20  seconds Comprehensive review of HEP  Functional Activities: Step-down off 6 inch step with slow eccentrics 2 sets of 10   07/29/2023 Recumbent bike Seat 10 for 8 minutes Resistance Level 6 Seated straight leg raises 3 sets of 10 with 5# Prone knee extension stretch 2# for 3 minutes  Functional Activities: Double Leg Press 2 sets of 10 with 150# slow eccentrics Single leg Press 2 sets of 10 with 100# slow eccentrics  Vaso Rt knee 10 minutes  Medium 34*   07/27/2023 Recumbent bike Seat 9 for 6 minutes Level 6 Resistance Standing gastroc stretch on slant board x30 Standing soleus stretch on slant board x 30 Standing hamstring stretch x 30 Seated figure 4 stretch x 30 Seated piriformis stretch x 30 Double leg press at 100# x10, 150# 2x10 Single leg press 75# 2x10 Standing balance on airex tap fwd, side, bwd 2x10 Fwd step up on airex and 6 step 2x10 Eccentric side step down 6 step no UE support 2x10 Eccentric fwd step down 4 step no UE support 2x10   PATIENT EDUCATION:  Education details: Reviewed exam findings and day 1 home exercises Person educated: Patient Education method: Explanation, Demonstration, Tactile cues, Verbal cues, and Handouts Education comprehension: verbalized understanding, returned demonstration, verbal cues required, tactile cues required, and needs further education  HOME EXERCISE PROGRAM: Access Code: [email or web address] URL: [email or web address] Date: 08/05/2023 Prepared by: Frank Clarke  Exercises - Supine Quadricep Sets  - 5 x daily - 1 x weekly - 2 sets - 10 reps - 5 second hold - Seated Knee Flexion AAROM  - 3 x daily - 1 x weekly - 1 sets - 10 reps - 10 seconds hold - Small Range Straight Leg Raise  - 1 x daily - 2-3 x weekly - 8-10 sets - 10 reps - 3 seconds hold - Lateral Step Down (Mirrored)  - 1 x daily - 2-3 x weekly - 2-3 sets - 10 reps - Standing Dorsiflexion Self-Mobilization on Step  - 1-2 x daily - 1 x weekly - 1 sets -  10 reps - 2-3 hold - Standing Bilateral Gastroc Stretch with Step  - 1-2 x daily - 1 x weekly - 1 sets - 3-5 reps - 30 hold - Supine Single Knee to Chest Stretch  - 2 x daily - 1 x weekly - 1 sets - 3-5 reps - 15 hold - Supine Piriformis Stretch with Foot on Ground  - 2 x daily - 1 x weekly - 1 sets - 5 reps - 30 hold - Supine Bridge  - 1-2 x daily - 1 x weekly - 1-2 sets - 10 reps - 2 hold - Prone Quadricep Stretch with Strap  - 1 x daily - 7 x weekly - 1 sets - 5 reps - 20 seconds hold - Prone Knee Extension with Ankle Weight  - 1 x daily - 7 x weekly - 1 sets - 1 reps - 3-5 minutes hold  ASSESSMENT:  CLINICAL IMPRESSION: Frank Clarke continues to do an excellent job with his supervised physical therapy.  Objective knee extension active range of motion was improved today and Frank Clarke continues to be compliant with his HEP, including a prone knee extension stretch.  Given that he is only in week 7 post-surgery and the high physical demand level he has for his knees, Frank Clarke would like to continue supervised physical therapy to make sure he gets his full knee extension AROM, strength and to comfortable returning to his normal functional activities before discharge from supervised physical therapy.  OBJECTIVE IMPAIRMENTS: Abnormal gait, decreased activity tolerance, decreased endurance, decreased knowledge of condition, difficulty walking, decreased ROM, decreased strength, increased edema, and pain.   ACTIVITY LIMITATIONS: bending, standing, squatting, sleeping, stairs, and locomotion level  PARTICIPATION LIMITATIONS: driving, community activity, and occupation  PERSONAL FACTORS: OA, HTN, multiple knee arthroscopies, Right shoulder arthroscopy are also affecting patient's functional outcome.   REHAB POTENTIAL: Excellent  CLINICAL DECISION MAKING: Stable/uncomplicated  EVALUATION COMPLEXITY: Low   GOALS: Goals  reviewed with patient? Yes  SHORT TERM GOALS: Target date: 08/05/2023 Frank Clarke will be  independent with his day 1 home exercise program Baseline: Started 07/08/2023 Goal status: Met 07/21/2023  2.  Improve right knee extension active range of motion to within 3 degrees of neutral Baseline: Within 5 degrees of neutral Goal status: Met 07/21/2023  3.  Improve right knee flexion active range of motion to at least 120 degrees Baseline: 115 degrees Goal status: Met 07/21/2023   LONG TERM GOALS: Target date: 09/02/2023  Improve METS patient specific functional score to at least 7.75 Baseline: 5.75 Goal status: Met 07/29/2023  2.  Frank Clarke will report right knee pain consistently 0-3/10 on the visual analog scale Baseline: 2-5/10 Goal status: Met 07/29/2023  3.  Frank Clarke will have at least 80 pounds of right quadriceps strength as assessed by hand-held dynamometer Baseline: 64.7 pounds Goal status: Ongoing 07/29/2023  4.  Frank Clarke will be able to walk enough to perform his essential job duties and basic ADLs without an assistive device or increasing pain Baseline: Desk duty at work and using a cane outside the home Goal status: Ongoing 08/05/2023  5.  Frank Clarke will be independent with his long-term home maintenance exercise program at discharge Baseline: Started 07/08/2023 Goal status: Ongoing 08/05/2023  PLAN:  PT FREQUENCY:  1- 2 x a week  PT DURATION: Up to 09/02/2023  PLANNED INTERVENTIONS: 97750- Physical Performance Testing, 97110-Therapeutic exercises, 97530- Therapeutic activity, 97112- Neuromuscular re-education, 97535- Self Care, 02859- Manual therapy, [phone]- Gait training, [phone]- Electrical stimulation (unattended), 97016- Vasopneumatic device, Patient/Family education, Balance training, Stair training, Joint mobilization, and Cryotherapy  PLAN FOR NEXT SESSION: Knee extension active range of motion, Quadriceps strength, balance and functional emphasis for likely early transition to independent PT.  Myer LELON Clarke, PT, MPT 08/05/23  9:30 AM

## 2023-08-09 ENCOUNTER — Encounter: Admitting: Rehabilitative and Restorative Service Providers"

## 2023-08-12 ENCOUNTER — Encounter: Admitting: Physical Therapy

## 2023-08-16 DIAGNOSIS — Z0289 Encounter for other administrative examinations: Secondary | ICD-10-CM

## 2023-08-23 ENCOUNTER — Encounter: Payer: Self-pay | Admitting: Rehabilitative and Restorative Service Providers"

## 2023-08-23 ENCOUNTER — Ambulatory Visit (INDEPENDENT_AMBULATORY_CARE_PROVIDER_SITE_OTHER): Admitting: Rehabilitative and Restorative Service Providers"

## 2023-08-23 DIAGNOSIS — M25561 Pain in right knee: Secondary | ICD-10-CM

## 2023-08-23 DIAGNOSIS — R600 Localized edema: Secondary | ICD-10-CM | POA: Diagnosis not present

## 2023-08-23 DIAGNOSIS — M6281 Muscle weakness (generalized): Secondary | ICD-10-CM

## 2023-08-23 DIAGNOSIS — R262 Difficulty in walking, not elsewhere classified: Secondary | ICD-10-CM | POA: Diagnosis not present

## 2023-08-23 DIAGNOSIS — M25661 Stiffness of right knee, not elsewhere classified: Secondary | ICD-10-CM | POA: Diagnosis not present

## 2023-08-23 NOTE — Therapy (Signed)
 OUTPATIENT PHYSICAL THERAPY TREATMENT   Patient Name: Frank Clarke MRN: [id number] DOB:Jun 16, 1980, 43 y.o., male Today's Date: 08/23/2023  END OF SESSION:  PT End of Session - 08/23/23 1028     Visit Number 7    Number of Visits 16    Date for PT Re-Evaluation 09/02/23    Authorization Type U[employer]    Progress Note Due on Visit 10    PT Start Time 1018    PT Stop Time 1101    PT Time Calculation (min) 43 min    Activity Tolerance Patient tolerated treatment well;No increased pain;Patient limited by pain    Behavior During Therapy [facility] for tasks assessed/performed             Past Medical History:  Diagnosis Date   Arthritis    Dental crowns present    Heart murmur    Hypertension    Irritable bowel    Perianal abscess 10/2016   Sinus congestion 10/29/2016   Sleep apnea    cpap   Past Surgical History:  Procedure Laterality Date   ASD REPAIR     age 47   CHOLECYSTECTOMY     EVALUATION UNDER ANESTHESIA WITH FISTULECTOMY Left 11/04/2016   Procedure: EXAM UNDER ANESTHESIA WITH FISTULOTOMY;  Surgeon: Vernetta Berg, MD;  Location: [facility];  Service: General;  Laterality: Left;   KNEE ARTHROSCOPY Right    x 2   KNEE ARTHROSCOPY Left    SHOULDER ARTHROSCOPY Right    TOTAL KNEE ARTHROPLASTY Right 06/18/2023   Procedure: ARTHROPLASTY, KNEE, TOTAL;  Surgeon: Vernetta Lonni GRADE, MD;  Location: [facility];  Service: Orthopedics;  Laterality: Right;   Patient Active Problem List   Diagnosis Date Noted   Unilateral primary osteoarthritis, right knee 06/17/2023    PCP: Camie Franchot Mirza, PA-C  REFERRING PROVIDER: Lonni GRADE Vernetta, MD  REFERRING DIAG: [phone] (ICD-10-CM) - Status post total right knee replacement  THERAPY DIAG:  Difficulty in walking, not elsewhere classified  Muscle weakness (generalized)  Localized edema  Stiffness of right knee, not elsewhere classified  Acute pain of right knee  Rationale for Evaluation and  Treatment: Rehabilitation  ONSET DATE: Jun 18, 2023 Rt TKA  SUBJECTIVE:   SUBJECTIVE STATEMENT: Frank Clarke reports he is working hard at [employer] 3 x a week.  He has some infra-patellar pain with descending stairs that is consistent with patellar tendinitis.  PERTINENT HISTORY: OA, HTN, multiple knee arthroscopies, Right shoulder arthroscopy  PAIN:  NPRS scale: 0-4/10 this week (with overdoing stairs) Pain location: Rt knee Pain description: Achy, sore, stiff Aggravating factors: Later in the day and at night, overuse Relieving factors: Tylenol  during the day every 6 hours (more PRN at this point) and 300 mg Gabapentin  1 x a day  PRECAUTIONS: None and Other: Left knee arthritis  RED FLAGS: None   WEIGHT BEARING RESTRICTIONS: No  FALLS:  Has patient fallen in last 6 months? No  LIVING ENVIRONMENT: Lives with: lives with their family, lives with their spouse, and lives with their daughter Lives in: House/apartment Stairs: Uses handrail and /or a cane Has following equipment at home: Single point cane  OCCUPATION: Scientist, forensic  PLOF: Independent  PATIENT GOALS: Get back to normal driving, work and functional activities  NEXT MD VISIT: 11/04/2023  OBJECTIVE:  Note: Objective measures were completed at Evaluation unless otherwise noted.  DIAGNOSTIC FINDINGS:  2 views of the left knee show significant arthritis of the patellofemoral  joint and osteophytes in all 3  compartments.   2 views of the right knee show significant tricompartment arthritis  especially at the patellofemoral joint.  There are osteophytes in all 3  compartments.  PATIENT SURVEYS:  Patient-Specific Activity Scoring Scheme  0 represents "unable to perform." 10 represents "able to perform at prior level. 0 1 2 3 4 5 6 7 8 9  10 (Date and Score)   Activity Eval  6/19/202530/2025  07/29/2023  1.  Walking 5/10  9/10  2.  Descending stairs 5/10  7/10  3.  Sleeping in a bed 5/10 7/10  4.   Sitting in a chair 8/10 10/10  5.    Score 5.75 8.25   Total score = sum of the activity scores/number of activities Minimum detectable change (90%CI) for average score = 2 points Minimum detectable change (90%CI) for single activity score = 3 points     COGNITION: 07/08/2023 Overall cognitive status: Within functional limits for tasks assessed     SENSATION: 07/08/2023 [facility]  EDEMA:  07/08/2023 Noted and not objectively assessed   LOWER EXTREMITY ROM & FLEXIBILITY:  Active ROM Right 07/08/2023 Left 07/08/2023 Right 07/21/2023 Left/Right 07/29/2023 Right 08/05/2023 Left/Right 08/23/2023  Hip flexion        Hip extension        Hip abduction        Hip adduction        Hip internal rotation        Hip external rotation        Knee flexion 115 133 122 139/127  125  Knee extension -5 0 -3 0/-3 -2 0  Ankle dorsiflexion        Ankle plantarflexion        Ankle inversion        Ankle eversion        Hamstrings      50/40  Quadriceps       120/100  Heel Cords      10/7   (Blank rows = not tested)  LOWER EXTREMITY STRENGTH:   Left/Right 07/08/2023 dynamometry Left 07/08/2023 dynamometry Right 08/23/2023  Hip flexion     Hip extension     Hip abduction     Hip adduction     Hip internal rotation     Hip external rotation     Knee flexion     Knee extension 64.7 lbs 100 lbs  92.6 lbs  Ankle dorsiflexion     Ankle plantarflexion     Ankle inversion     Ankle eversion      (Blank rows = not tested)  GAIT: 07/19/2023: Independent ambulation c no observed deviations in clinic level surface ambulation.   07/08/2023 Distance walked: 100 feet Assistive device utilized: Single point cane Level of assistance: Complete Independence Comments: Frank Clarke is using the cane outside the house but he mentions he is not using any assistive device inside the house                  TREATMENT         DATE:   08/23/2023 Knee extension machine up bilateral, down right only 35# 2 sets of  10 Quadriceps stretch prone 5 x 20 seconds with belt over opposite shoulder Hamstrings stretch supine with ankle dorsiflexion and strap, other leg straight 5 x 20 seconds Upper and lower heel cord box 1 minute each  Functional Activities: Step-down off 4 inch step with slow eccentrics 2 sets of 10 with 1 Upper Extremity support  Neuromuscular re-education: Single-leg  balance eyes closed 4 x 10 seconds bilateral   08/05/2023 Recumbent bike 9 Seat 4 Resistance 5-6 for 5 minutes Seated straight leg raises 4 sets of 10 with 5# Prone knee extension stretch 2# for 3 minutes Prone quadriceps stretch 5 x 20 seconds Comprehensive review of HEP  Functional Activities: Step-down off 6 inch step with slow eccentrics 2 sets of 10   07/29/2023 Recumbent bike Seat 10 for 8 minutes Resistance Level 6 Seated straight leg raises 3 sets of 10 with 5# Prone knee extension stretch 2# for 3 minutes  Functional Activities: Double Leg Press 2 sets of 10 with 150# slow eccentrics Single leg Press 2 sets of 10 with 100# slow eccentrics  Vaso Rt knee 10 minutes Medium 34*   HOME EXERCISE PROGRAM: Access Code: [email or web address] URL: [email or web address] Date: 08/05/2023 Prepared by: Frank Clarke  Exercises - Supine Quadricep Sets  - 5 x daily - 1 x weekly - 2 sets - 10 reps - 5 second hold - Seated Knee Flexion AAROM  - 3 x daily - 1 x weekly - 1 sets - 10 reps - 10 seconds hold - Small Range Straight Leg Raise  - 1 x daily - 2-3 x weekly - 8-10 sets - 10 reps - 3 seconds hold - Lateral Step Down (Mirrored)  - 1 x daily - 2-3 x weekly - 2-3 sets - 10 reps - Standing Dorsiflexion Self-Mobilization on Step  - 1-2 x daily - 1 x weekly - 1 sets - 10 reps - 2-3 hold - Standing Bilateral Gastroc Stretch with Step  - 1-2 x daily - 1 x weekly - 1 sets - 3-5 reps - 30 hold - Supine Single Knee to Chest Stretch  - 2 x daily - 1 x weekly - 1 sets - 3-5 reps - 15 hold - Supine Piriformis Stretch with  Foot on Ground  - 2 x daily - 1 x weekly - 1 sets - 5 reps - 30 hold - Supine Bridge  - 1-2 x daily - 1 x weekly - 1-2 sets - 10 reps - 2 hold - Prone Quadricep Stretch with Strap  - 1 x daily - 7 x weekly - 1 sets - 5 reps - 20 seconds hold - Prone Knee Extension with Ankle Weight  - 1 x daily - 7 x weekly - 1 sets - 1 reps - 3-5 minutes hold  ASSESSMENT:  CLINICAL IMPRESSION: Frank Clarke continues to do an excellent job with his mostly independent physical therapy.  Objective knee extension active range of motion was improved to 0 - 125 degrees.  Frank Clarke does have some signs and symptoms of patellar tendinitis and we discussed the importance of avoiding overuse along with the importance of eccentrics with his strength work to address this.  Due to his high physical demand level he has for his knees, Frank Clarke would like to continue supervised physical therapy t to address this new overuse problem and make sure he is comfortable returning to his normal functional activities before discharge from supervised physical therapy.  OBJECTIVE IMPAIRMENTS: Abnormal gait, decreased activity tolerance, decreased endurance, decreased knowledge of condition, difficulty walking, decreased ROM, decreased strength, increased edema, and pain.   ACTIVITY LIMITATIONS: bending, standing, squatting, sleeping, stairs, and locomotion level  PARTICIPATION LIMITATIONS: driving, community activity, and occupation  PERSONAL FACTORS: OA, HTN, multiple knee arthroscopies, Right shoulder arthroscopy are also affecting patient's functional outcome.   REHAB POTENTIAL: Excellent  CLINICAL DECISION MAKING: Stable/uncomplicated  EVALUATION COMPLEXITY: Low  GOALS: Goals reviewed with patient? Yes  SHORT TERM GOALS: Target date: 08/05/2023 Frank Clarke will be independent with his day 1 home exercise program Baseline: Started 07/08/2023 Goal status: Met 07/21/2023  2.  Improve right knee extension active range of motion to within 3 degrees of  neutral Baseline: Within 5 degrees of neutral Goal status: Met 07/21/2023  3.  Improve right knee flexion active range of motion to at least 120 degrees Baseline: 115 degrees Goal status: Met 07/21/2023   LONG TERM GOALS: Target date: 09/02/2023  Improve METS patient specific functional score to at least 7.75 Baseline: 5.75 Goal status: Met 07/29/2023  2.  Frank Clarke will report right knee pain consistently 0-3/10 on the visual analog scale Baseline: 2-5/10 Goal status: Met 07/29/2023  3.  Frank Clarke will have at least 80 pounds of right quadriceps strength as assessed by hand-held dynamometer Baseline: 64.7 pounds Goal status: Met 08/23/2023  4.  Frank Clarke will be able to walk enough to perform his essential job duties and basic ADLs without an assistive device or increasing pain Baseline: Desk duty at work and using a cane outside the home Goal status: Ongoing 08/23/2023  5.  Frank Clarke will be independent with his long-term home maintenance exercise program at discharge Baseline: Started 07/08/2023 Goal status: Ongoing 08/23/2023  PLAN:  PT FREQUENCY:  1- 2 x a week  PT DURATION: Up to 09/02/2023  PLANNED INTERVENTIONS: 97750- Physical Performance Testing, 97110-Therapeutic exercises, 97530- Therapeutic activity, 97112- Neuromuscular re-education, 97535- Self Care, 02859- Manual therapy, [phone]- Gait training, [phone]- Electrical stimulation (unattended), 97016- Vasopneumatic device, Patient/Family education, Balance training, Stair training, Joint mobilization, and Cryotherapy  PLAN FOR NEXT SESSION: Quadriceps strength (eccentric emphasis to address early phase tendonitis), balance and functional emphasis for likely transition to independent PT.  Frank Clarke, PT, MPT 08/23/23  3:45 PM

## 2023-08-24 ENCOUNTER — Other Ambulatory Visit: Payer: Self-pay | Admitting: Physician Assistant

## 2023-09-13 NOTE — Telephone Encounter (Signed)
Scheduled wednesday

## 2023-09-15 ENCOUNTER — Other Ambulatory Visit: Payer: Self-pay

## 2023-09-15 ENCOUNTER — Ambulatory Visit (INDEPENDENT_AMBULATORY_CARE_PROVIDER_SITE_OTHER): Admitting: Physician Assistant

## 2023-09-15 DIAGNOSIS — M25561 Pain in right knee: Secondary | ICD-10-CM

## 2023-09-15 DIAGNOSIS — Z96651 Presence of right artificial knee joint: Secondary | ICD-10-CM

## 2023-09-15 NOTE — Progress Notes (Signed)
 HPI: [name] returns today status post right total knee arthroplasty 06/18/2023.  This had increased swelling pain in the knee over the last couple weeks.  Notes that the knee feels like it is giving way on him.  He has had no known injury.  He continues to work with physical therapy and has 1 final visit coming up soon.  He had noted particular injury to the knee.  Most of the discomfort he is having has been in the posterior aspect of the knee.  He remains on light duty at work.  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  Review of systems: See HPI otherwise negative  Physical exam: General well-developed well-nourished male who ambulates with no assistive device. Right knee full range of motion.  Surgical incision slight keloid but no signs of infection or wound dehiscence.  There is no abnormal warmth erythema or effusion.  Valgus varus stressing reveals no instability.  Anterior posterior drawer is negative.  Slight patellofemoral crepitus with passive range of motion.  Underdeveloped VMO on the right compared to the left.  Radiographs:Right knee 2 views: Shows press-fit knee well-seated components.  No hardware failure.  Knee is well located.  No acute fractures or complicating features.  Impression: Status post right total knee arthroplasty   Plan: He will continue work with therapy on range of motion strengthening.  Particularly he will work on [employer] I did discuss exercises to help with this.  Will see him back at his regularly scheduled appointment in October no radiographs at that time unless clinically indicated.  He will follow-up with us  sooner if there is any questions concerns.  Did discuss with him picking up an open patella copper fit knee brace and see if this affords him some stability.

## 2023-09-16 ENCOUNTER — Ambulatory Visit (INDEPENDENT_AMBULATORY_CARE_PROVIDER_SITE_OTHER): Admitting: Rehabilitative and Restorative Service Providers"

## 2023-09-16 ENCOUNTER — Encounter: Payer: Self-pay | Admitting: Rehabilitative and Restorative Service Providers"

## 2023-09-16 DIAGNOSIS — M25661 Stiffness of right knee, not elsewhere classified: Secondary | ICD-10-CM

## 2023-09-16 DIAGNOSIS — M6281 Muscle weakness (generalized): Secondary | ICD-10-CM | POA: Diagnosis not present

## 2023-09-16 DIAGNOSIS — R600 Localized edema: Secondary | ICD-10-CM | POA: Diagnosis not present

## 2023-09-16 DIAGNOSIS — R262 Difficulty in walking, not elsewhere classified: Secondary | ICD-10-CM

## 2023-09-16 DIAGNOSIS — M25561 Pain in right knee: Secondary | ICD-10-CM

## 2023-09-16 NOTE — Therapy (Signed)
 OUTPATIENT PHYSICAL THERAPY TREATMENT/PROGRESS NOTE  Progress Note Reporting Period 07/08/2023 to 09/16/2023  See note below for Objective Data and Assessment of Progress/Goals.     Patient Name: WILMOT QUEVEDO MRN: [id number] DOB:12-29-1980, 43 y.o., male Today's Date: 09/16/2023  END OF SESSION:  PT End of Session - 09/16/23 0846     Visit Number 8    Number of Visits 16    Date for PT Re-Evaluation 09/02/23    Authorization Type [employer]    Progress Note Due on Visit 10    PT Start Time 0845    PT Stop Time 0932    PT Time Calculation (min) 47 min    Activity Tolerance Patient tolerated treatment well;No increased pain;Patient limited by pain    Behavior During Therapy [facility] for tasks assessed/performed              Past Medical History:  Diagnosis Date   Arthritis    Dental crowns present    Heart murmur    Hypertension    Irritable bowel    Perianal abscess 10/2016   Sinus congestion 10/29/2016   Sleep apnea    cpap   Past Surgical History:  Procedure Laterality Date   ASD REPAIR     age 4   CHOLECYSTECTOMY     EVALUATION UNDER ANESTHESIA WITH FISTULECTOMY Left 11/04/2016   Procedure: EXAM UNDER ANESTHESIA WITH FISTULOTOMY;  Surgeon: Vernetta Berg, MD;  Location: [facility];  Service: General;  Laterality: Left;   KNEE ARTHROSCOPY Right    x 2   KNEE ARTHROSCOPY Left    SHOULDER ARTHROSCOPY Right    TOTAL KNEE ARTHROPLASTY Right 06/18/2023   Procedure: ARTHROPLASTY, KNEE, TOTAL;  Surgeon: Vernetta Lonni GRADE, MD;  Location: [facility];  Service: Orthopedics;  Laterality: Right;   Patient Active Problem List   Diagnosis Date Noted   Unilateral primary osteoarthritis, right knee 06/17/2023    PCP: Camie Franchot Mirza, PA-C  REFERRING PROVIDER: Lonni GRADE Vernetta, MD  REFERRING DIAG: [phone] (ICD-10-CM) - Status post total right knee replacement  THERAPY DIAG:  Difficulty in walking, not elsewhere classified - Plan: PT plan of  care cert/re-cert  Muscle weakness (generalized) - Plan: PT plan of care cert/re-cert  Localized edema - Plan: PT plan of care cert/re-cert  Stiffness of right knee, not elsewhere classified - Plan: PT plan of care cert/re-cert  Acute pain of right knee - Plan: PT plan of care cert/re-cert  Rationale for Evaluation and Treatment: Rehabilitation  ONSET DATE: Jun 18, 2023 Rt TKA  SUBJECTIVE:   SUBJECTIVE STATEMENT: Although well ahead of what I expect < 3 months post-TKA, [name] some lateral instability with side to side movements.  He saw Tory yesterday and everything looks good from a hardware stand-point.  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  PERTINENT HISTORY: OA, HTN, multiple knee arthroscopies, Right shoulder arthroscopy  PAIN:  NPRS scale: 0-4/10 this week (with lateral movements) Pain location: Rt knee Pain description: Achy, sore, stiff Aggravating factors: Later in the day, overuse, lateral movement Relieving factors: Tylenol  during the day PRN and 300 mg Gabapentin  1 x a day  PRECAUTIONS: None and Other: Left knee arthritis  RED FLAGS: None   WEIGHT BEARING RESTRICTIONS: No  FALLS:  Has patient fallen in last 6 months? No  LIVING ENVIRONMENT: Lives with: lives with their family, lives with their spouse,  and lives with their daughter Lives in: House/apartment Stairs: Uses handrail and /or a cane Has following equipment at home: Single point cane  OCCUPATION: Scientist, forensic  PLOF: Independent  PATIENT GOALS: Get back to normal driving, work and functional activities  NEXT MD VISIT: 11/04/2023  OBJECTIVE:  Note: Objective measures were completed at Evaluation unless otherwise noted.  DIAGNOSTIC FINDINGS:  2 views of the left knee show significant arthritis of the patellofemoral  joint and osteophytes in all 3 compartments.   2  views of the right knee show significant tricompartment arthritis  especially at the patellofemoral joint.  There are osteophytes in all 3  compartments.  PATIENT SURVEYS:  Patient-Specific Activity Scoring Scheme  0 represents "unable to perform." 10 represents "able to perform at prior level. 0 1 2 3 4 5 6 7 8 9  10 (Date and Score)   Activity Eval  6/19/202530/2025  07/29/2023  1.  Walking 5/10  9/10  2.  Descending stairs 5/10  7/10  3.  Sleeping in a bed 5/10 7/10  4.  Sitting in a chair 8/10 10/10  5.    Score 5.75 8.25   Total score = sum of the activity scores/number of activities Minimum detectable change (90%CI) for average score = 2 points Minimum detectable change (90%CI) for single activity score = 3 points     COGNITION: 07/08/2023 Overall cognitive status: Within functional limits for tasks assessed     SENSATION: 07/08/2023 [facility]  EDEMA:  07/08/2023 Noted and not objectively assessed   LOWER EXTREMITY ROM & FLEXIBILITY:  Active ROM Right 07/08/2023 Left 07/08/2023 Right 07/21/2023 Left/Right 07/29/2023 Right 08/05/2023 Left/Right 08/23/2023 Right 09/16/2023  Hip flexion         Hip extension         Hip abduction         Hip adduction         Hip internal rotation         Hip external rotation         Knee flexion 115 133 122 139/127  125 125  Knee extension -5 0 -3 0/-3 -2 0 0  Ankle dorsiflexion         Ankle plantarflexion         Ankle inversion         Ankle eversion         Hamstrings      50/40   Quadriceps       120/100   Heel Cords      10/7    (Blank rows = not tested)  LOWER EXTREMITY STRENGTH:   Left/Right 07/08/2023 dynamometry Left 07/08/2023 dynamometry Right 08/23/2023  Hip flexion     Hip extension     Hip abduction     Hip adduction     Hip internal rotation     Hip external rotation     Knee flexion     Knee extension 64.7 lbs 100 lbs  92.6 lbs  Ankle dorsiflexion     Ankle plantarflexion     Ankle inversion     Ankle  eversion      (Blank rows = not tested)  GAIT: 07/19/2023: Independent ambulation c no observed deviations in clinic level surface ambulation.   07/08/2023 Distance walked: 100 feet Assistive device utilized: Single point cane Level of assistance: Complete Independence Comments: [name] is using the cane outside the house but he mentions he is not using any assistive device inside the house  TREATMENT         DATE:   09/16/2023 Recumbent bike Seat 9 for 5 minutes Level 5  Hip hike in door frame 10 x 5 seconds Hip hike and push with hip IR 5 x 5 seconds  Neuromuscular re-education: Single leg balance: Eyes open; eyes closed 2 x 20 seconds each side Y balance (stand Rt, reach Lt) 5 x  Standing single leg reach (stand Rt, reach Lt with 6 targets in a 360 degree range)   08/23/2023 Knee extension machine up bilateral, down right only 35# 2 sets of 10 Quadriceps stretch prone 5 x 20 seconds with belt over opposite shoulder Hamstrings stretch supine with ankle dorsiflexion and strap, other leg straight 5 x 20 seconds Upper and lower heel cord box 1 minute each  Functional Activities: Step-down off 4 inch step with slow eccentrics 2 sets of 10 with 1 Upper Extremity support  Neuromuscular re-education: Single-leg balance eyes closed 4 x 10 seconds bilateral   08/05/2023 Recumbent bike 9 Seat 4 Resistance 5-6 for 5 minutes Seated straight leg raises 4 sets of 10 with 5# Prone knee extension stretch 2# for 3 minutes Prone quadriceps stretch 5 x 20 seconds Comprehensive review of HEP  Functional Activities: Step-down off 6 inch step with slow eccentrics 2 sets of 10   HOME EXERCISE PROGRAM: Access Code: [email or web address] URL: [email or web address] Date: 09/16/2023 Prepared by: Lamar Ivory  Exercises - Small Range Straight Leg Raise  - 1 x daily - 2-3 x weekly - 8-10 sets - 10 reps - 3 seconds hold - Lateral Step Down (Mirrored)  - 1 x daily - 2-3 x weekly  - 2-3 sets - 10 reps - Prone Quadricep Stretch with Strap  - 1 x daily - 7 x weekly - 1 sets - 5 reps - 20 seconds hold - Single Leg Stance  - 1 x daily - 7 x weekly - 1 sets - 10 reps - 20 seconds hold - Standing Hip Hiking  - 3 x daily - 7 x weekly - 1 sets - 10 reps - 7-10 seconds hold  ASSESSMENT:  CLINICAL IMPRESSION: [name] is doing great for this point post-TKA.  He has legitimate concerns with his stability/strength and lateral movement as this affects his ability to do his job.  Most people would be appropriate for transition into independent PT at this point.  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  I anticipate [name] will be ready for independent PT in a month.    OBJECTIVE IMPAIRMENTS: Abnormal gait, decreased activity tolerance, decreased endurance, decreased knowledge of condition, difficulty walking, decreased ROM, decreased strength, increased edema, and pain.   ACTIVITY LIMITATIONS: bending, standing, squatting, sleeping, stairs, and locomotion level  PARTICIPATION LIMITATIONS: driving, community activity, and occupation  PERSONAL FACTORS: OA, HTN, multiple knee arthroscopies, Right shoulder arthroscopy are also affecting patient's functional outcome.   REHAB POTENTIAL: Excellent  CLINICAL DECISION MAKING: Stable/uncomplicated  EVALUATION COMPLEXITY: Low   GOALS: Goals reviewed with patient? Yes  SHORT TERM GOALS: Target date: 08/05/2023 [name] will be independent with his day 1 home exercise program Baseline: Started 07/08/2023 Goal status: Met 07/21/2023  2.  Improve right knee extension active range of motion to within 3 degrees of neutral Baseline: Within 5  degrees of neutral Goal status: Met  07/21/2023  3.  Improve right knee flexion active range of motion to at least 120 degrees Baseline: 115 degrees Goal status: Met 07/21/2023   LONG TERM GOALS: Target date: 09/02/2023  Improve METS patient specific functional score to at least 7.75 Baseline: 5.75 Goal status: Met 07/29/2023  2.  [name] will report right knee pain consistently 0-3/10 on the visual analog scale Baseline: 2-5/10 Goal status: Met 07/29/2023  3.  [name] will have at least 80 pounds of right quadriceps strength as assessed by hand-held dynamometer Baseline: 64.7 pounds Goal status: Met 08/23/2023  4.  [name] will be able to walk enough to perform his essential job duties and basic ADLs without an assistive device or increasing pain Baseline: Desk duty at work and using a cane outside the home Goal status: Ongoing 09/16/2023  5.  [name] will be independent with his long-term home maintenance exercise program at discharge Baseline: Started 07/08/2023 Goal status: Ongoing 09/16/2023  PLAN:  PT FREQUENCY: 2 x a week  PT DURATION: Up to 10/17/2023  PLANNED INTERVENTIONS: 97750- Physical Performance Testing, 97110-Therapeutic exercises, 97530- Therapeutic activity, 97112- Neuromuscular re-education, 97535- Self Care, 02859- Manual therapy, [phone]- Gait training, [phone]- Electrical stimulation (unattended), 97016- Vasopneumatic device, Patient/Family education, Balance training, Stair training, Joint mobilization, and Cryotherapy  PLAN FOR NEXT SESSION: Quadriceps strength (eccentric emphasis to address early phase tendonitis), 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  How is patellar tendonitis progressing?  Myer LELON Ivory, PT, MPT 09/16/23  2:53 PM

## 2023-09-23 ENCOUNTER — Ambulatory Visit: Admitting: Rehabilitative and Restorative Service Providers"

## 2023-09-23 ENCOUNTER — Encounter: Payer: Self-pay | Admitting: Rehabilitative and Restorative Service Providers"

## 2023-09-23 DIAGNOSIS — R262 Difficulty in walking, not elsewhere classified: Secondary | ICD-10-CM

## 2023-09-23 DIAGNOSIS — M25661 Stiffness of right knee, not elsewhere classified: Secondary | ICD-10-CM | POA: Diagnosis not present

## 2023-09-23 DIAGNOSIS — M25561 Pain in right knee: Secondary | ICD-10-CM

## 2023-09-23 DIAGNOSIS — R600 Localized edema: Secondary | ICD-10-CM | POA: Diagnosis not present

## 2023-09-23 DIAGNOSIS — M6281 Muscle weakness (generalized): Secondary | ICD-10-CM

## 2023-09-23 NOTE — Therapy (Signed)
 OUTPATIENT PHYSICAL THERAPY TREATMENT NOTE   Patient Name: Frank Clarke MRN: [id number] DOB:1980/02/03, 43 y.o., male Today's Date: 09/23/2023  END OF SESSION:  PT End of Session - 09/23/23 0849     Visit Number 9    Number of Visits 16    Date for PT Re-Evaluation 09/02/23    Authorization Type U[employer]    Progress Note Due on Visit 10    PT Start Time 0848    PT Stop Time 0930    PT Time Calculation (min) 42 min    Activity Tolerance Patient tolerated treatment well;No increased pain    Behavior During Therapy [facility] for tasks assessed/performed           Past Medical History:  Diagnosis Date   Arthritis    Dental crowns present    Heart murmur    Hypertension    Irritable bowel    Perianal abscess 10/2016   Sinus congestion 10/29/2016   Sleep apnea    cpap   Past Surgical History:  Procedure Laterality Date   ASD REPAIR     age 34   CHOLECYSTECTOMY     EVALUATION UNDER ANESTHESIA WITH FISTULECTOMY Left 11/04/2016   Procedure: EXAM UNDER ANESTHESIA WITH FISTULOTOMY;  Surgeon: Vernetta Berg, MD;  Location: [facility];  Service: General;  Laterality: Left;   KNEE ARTHROSCOPY Right    x 2   KNEE ARTHROSCOPY Left    SHOULDER ARTHROSCOPY Right    TOTAL KNEE ARTHROPLASTY Right 06/18/2023   Procedure: ARTHROPLASTY, KNEE, TOTAL;  Surgeon: Vernetta Lonni GRADE, MD;  Location: [facility];  Service: Orthopedics;  Laterality: Right;   Patient Active Problem List   Diagnosis Date Noted   Unilateral primary osteoarthritis, right knee 06/17/2023    PCP: Camie Franchot Mirza, PA-C  REFERRING PROVIDER: Lonni GRADE Vernetta, MD  REFERRING DIAG: [phone] (ICD-10-CM) - Status post total right knee replacement  THERAPY DIAG:  Difficulty in walking, not elsewhere classified  Muscle weakness (generalized)  Localized edema  Stiffness of right knee, not elsewhere classified  Acute pain of right knee  Rationale for Evaluation and Treatment:  Rehabilitation  ONSET DATE: Jun 19, 2023 Rt TKA  SUBJECTIVE:   SUBJECTIVE STATEMENT: Frank Clarke is doing better with his previous patellar tendinitis symptoms, although he does note inferior patellar soreness on particularly active days.  Good HEP compliance continues, particularly with new activities.  PERTINENT HISTORY: OA, HTN, multiple knee arthroscopies, Right shoulder arthroscopy  PAIN:  NPRS scale: 0-4/10 this week (with lateral movements) Pain location: Rt knee Pain description: Achy, sore, stiff Aggravating factors: Later in the day, overuse, lateral movement Relieving factors: Tylenol  during the day PRN (less often) and 300 mg Gabapentin  1 x a day  PRECAUTIONS: None and Other: Left knee arthritis  RED FLAGS: None   WEIGHT BEARING RESTRICTIONS: No  FALLS:  Has patient fallen in last 6 months? No  LIVING ENVIRONMENT: Lives with: lives with their family, lives with their spouse, and lives with their daughter Lives in: House/apartment Stairs: Uses handrail and /or a cane Has following equipment at home: Single point cane  OCCUPATION: Scientist, forensic  PLOF: Independent  PATIENT GOALS: Get back to normal driving, work and functional activities  NEXT MD VISIT: 11/10/2023  OBJECTIVE:  Note: Objective measures were completed at Evaluation unless otherwise noted.  DIAGNOSTIC FINDINGS:  2 views of the left knee show significant arthritis of the patellofemoral  joint and osteophytes in all 3 compartments.   2 views of the right  knee show significant tricompartment arthritis  especially at the patellofemoral joint.  There are osteophytes in all 3  compartments.  PATIENT SURVEYS:  Patient-Specific Activity Scoring Scheme  0 represents "unable to perform." 10 represents "able to perform at prior level. 0 1 2 3 4 5 6 7 8 9  10 (Date and Score)   Activity Eval  6/19/202530/2025  07/29/2023  1.  Walking 5/10  9/10  2.  Descending stairs 5/10  7/10  3.   Sleeping in a bed 5/10 7/10  4.  Sitting in a chair 8/10 10/10  5.    Score 5.75 8.25   Total score = sum of the activity scores/number of activities Minimum detectable change (90%CI) for average score = 2 points Minimum detectable change (90%CI) for single activity score = 3 points     COGNITION: 07/08/2023 Overall cognitive status: Within functional limits for tasks assessed     SENSATION: 07/08/2023 [facility]  EDEMA:  07/08/2023 Noted and not objectively assessed   LOWER EXTREMITY ROM & FLEXIBILITY:  Active ROM Right 07/08/2023 Left 07/08/2023 Right 07/21/2023 Left/Right 07/29/2023 Right 08/05/2023 Left/Right 08/23/2023 Right 09/16/2023  Hip flexion         Hip extension         Hip abduction         Hip adduction         Hip internal rotation         Hip external rotation         Knee flexion 115 133 122 139/127  125 125  Knee extension -5 0 -3 0/-3 -2 0 0  Ankle dorsiflexion         Ankle plantarflexion         Ankle inversion         Ankle eversion         Hamstrings      50/40   Quadriceps       120/100   Heel Cords      10/7    (Blank rows = not tested)  LOWER EXTREMITY STRENGTH:   Left/Right 07/08/2023 dynamometry Left 07/08/2023 dynamometry Right 08/23/2023  Hip flexion     Hip extension     Hip abduction     Hip adduction     Hip internal rotation     Hip external rotation     Knee flexion     Knee extension 64.7 lbs 100 lbs  92.6 lbs  Ankle dorsiflexion     Ankle plantarflexion     Ankle inversion     Ankle eversion      (Blank rows = not tested)  GAIT: 07/19/2023: Independent ambulation c no observed deviations in clinic level surface ambulation.   07/08/2023 Distance walked: 100 feet Assistive device utilized: Single point cane Level of assistance: Complete Independence Comments: Frank Clarke is using the cane outside the house but he mentions he is not using any assistive device inside the house                  TREATMENT         DATE:   09/23/2023 Recumbent  bike Seat 9 for 5 minutes Level 5  Hip abduction with pelvic stabilization 2 sets of 5 for 5 Knee extension machine 35# 15 x slow eccentrics 90-40 degrees Double Leg Heel Raises (up bilateral, down right only) 50 x Quadriceps stretch prone 5 x 20 seconds with belt over opposite shoulder Hamstrings stretch supine with ankle dorsiflexion and strap, other leg  straight 5 x 20 seconds Upper and lower heel cord box 1 minute each  Neuromuscular re-education: Y balance (stand Rt, reach Lt and stand Lt, reach Rt) 10 x each Standing single leg reach (stand Rt, reach Lt with 8 targets in a 360 degree range) 5 x  Single-leg ball toss on compliant surface (stand Rt leg) 3 x 60 seconds   09/16/2023 Recumbent bike Seat 9 for 5 minutes Level 5  Hip hike in door frame 10 x 5 seconds Hip hike and push with hip IR 5 x 5 seconds  Neuromuscular re-education: Single leg balance: Eyes open; eyes closed 2 x 20 seconds each side Y balance (stand Rt, reach Lt) 5 x  Standing single leg reach (stand Rt, reach Lt with 6 targets in a 360 degree range)   08/23/2023 Knee extension machine up bilateral, down right only 35# 2 sets of 10 Quadriceps stretch prone 5 x 20 seconds with belt over opposite shoulder Hamstrings stretch supine with ankle dorsiflexion and strap, other leg straight 5 x 20 seconds Upper and lower heel cord box 1 minute each  Functional Activities: Step-down off 4 inch step with slow eccentrics 2 sets of 10 with 1 Upper Extremity support  Neuromuscular re-education: Single-leg balance eyes closed 4 x 10 seconds bilateral   HOME EXERCISE PROGRAM: Access Code: [email or web address] URL: [email or web address] Date: 09/16/2023 Prepared by: Lamar Ivory  Exercises - Small Range Straight Leg Raise  - 1 x daily - 2-3 x weekly - 8-10 sets - 10 reps - 3 seconds hold - Lateral Step Down (Mirrored)  - 1 x daily - 2-3 x weekly - 2-3 sets - 10 reps - Prone Quadricep Stretch with Strap  - 1 x daily  - 7 x weekly - 1 sets - 5 reps - 20 seconds hold - Single Leg Stance  - 1 x daily - 7 x weekly - 1 sets - 10 reps - 20 seconds hold - Standing Hip Hiking  - 3 x daily - 7 x weekly - 1 sets - 10 reps - 7-10 seconds hold  ASSESSMENT:  CLINICAL IMPRESSION: Frank Clarke continues to do very well post-TKA.  He is participating in more advanced balance and functional activities in preparation for return to his high physical demand job.  Quadriceps strength, balance and functional activities remain high priorities at this point post-surgery.  OBJECTIVE IMPAIRMENTS: Abnormal gait, decreased activity tolerance, decreased endurance, decreased knowledge of condition, difficulty walking, decreased ROM, decreased strength, increased edema, and pain.   ACTIVITY LIMITATIONS: bending, standing, squatting, sleeping, stairs, and locomotion level  PARTICIPATION LIMITATIONS: driving, community activity, and occupation  PERSONAL FACTORS: OA, HTN, multiple knee arthroscopies, Right shoulder arthroscopy are also affecting patient's functional outcome.   REHAB POTENTIAL: Excellent  CLINICAL DECISION MAKING: Stable/uncomplicated  EVALUATION COMPLEXITY: Low   GOALS: Goals reviewed with patient? Yes  SHORT TERM GOALS: Target date: 08/05/2023 Frank Clarke will be independent with his day 1 home exercise program Baseline: Started 07/08/2023 Goal status: Met 07/21/2023  2.  Improve right knee extension active range of motion to within 3 degrees of neutral Baseline: Within 5 degrees of neutral Goal status: Met 07/21/2023  3.  Improve right knee flexion active range of motion to at least 120 degrees Baseline: 115 degrees Goal status: Met 07/21/2023   LONG TERM GOALS: Target date: 09/02/2023  Improve METS patient specific functional score to at least 7.75 Baseline: 5.75 Goal status: Met 07/29/2023  2.  Frank Clarke will report right knee pain consistently 0-3/10 on the visual  analog scale Baseline: 2-5/10 Goal status: Met  07/29/2023  3.  Frank Clarke will have at least 80 pounds of right quadriceps strength as assessed by hand-held dynamometer Baseline: 64.7 pounds Goal status: Met 08/23/2023  4.  Frank Clarke will be able to walk enough to perform his essential job duties and basic ADLs without an assistive device or increasing pain Baseline: Desk duty at work and using a cane outside the home Goal status: Ongoing 09/23/2023  5.  Frank Clarke will be independent with his long-term home maintenance exercise program at discharge Baseline: Started 07/08/2023 Goal status: Ongoing 09/23/2023  PLAN:  PT FREQUENCY: 2 x a week  PT DURATION: Up to 10/17/2023  PLANNED INTERVENTIONS: 97750- Physical Performance Testing, 97110-Therapeutic exercises, 97530- Therapeutic activity, 97112- Neuromuscular re-education, 97535- Self Care, 02859- Manual therapy, [phone]- Gait training, [phone]- Electrical stimulation (unattended), 97016- Vasopneumatic device, Patient/Family education, Balance training, Stair training, Joint mobilization, and Cryotherapy  PLAN FOR NEXT SESSION: Continue higher level Quadriceps strength work (eccentric emphasis to address tendonitis), 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    Myer LELON Ivory, PT, MPT 09/23/23  9:33 AM

## 2023-10-14 ENCOUNTER — Ambulatory Visit: Admitting: Rehabilitative and Restorative Service Providers"

## 2023-10-14 ENCOUNTER — Encounter: Payer: Self-pay | Admitting: Rehabilitative and Restorative Service Providers"

## 2023-10-14 DIAGNOSIS — M25661 Stiffness of right knee, not elsewhere classified: Secondary | ICD-10-CM | POA: Diagnosis not present

## 2023-10-14 DIAGNOSIS — M6281 Muscle weakness (generalized): Secondary | ICD-10-CM | POA: Diagnosis not present

## 2023-10-14 DIAGNOSIS — R600 Localized edema: Secondary | ICD-10-CM | POA: Diagnosis not present

## 2023-10-14 DIAGNOSIS — M25561 Pain in right knee: Secondary | ICD-10-CM

## 2023-10-14 DIAGNOSIS — R262 Difficulty in walking, not elsewhere classified: Secondary | ICD-10-CM | POA: Diagnosis not present

## 2023-10-14 NOTE — Therapy (Signed)
 OUTPATIENT PHYSICAL THERAPY TREATMENT NOTE   Patient Name: Frank Clarke MRN: [id number] DOB:1980-06-12, 43 y.o., male Today's Date: 10/14/2023  END OF SESSION:  PT End of Session - 10/14/23 0849     Visit Number 10    Number of Visits 16    Date for Recertification  09/02/23    Authorization Type [employer]    Progress Note Due on Visit 10    PT Start Time 0846    PT Stop Time 0928    PT Time Calculation (min) 42 min    Activity Tolerance Patient tolerated treatment well;No increased pain    Behavior During Therapy Frank Clarke for tasks assessed/performed            Past Medical History:  Diagnosis Date   Arthritis    Dental crowns present    Heart murmur    Hypertension    Irritable bowel    Perianal abscess 10/2016   Sinus congestion 10/29/2016   Sleep apnea    cpap   Past Surgical History:  Procedure Laterality Date   ASD REPAIR     age 75   CHOLECYSTECTOMY     EVALUATION UNDER ANESTHESIA WITH FISTULECTOMY Left 11/04/2016   Procedure: EXAM UNDER ANESTHESIA WITH FISTULOTOMY;  Surgeon: Clarke Berg, MD;  Location: [facility];  Service: General;  Laterality: Left;   KNEE ARTHROSCOPY Right    x 2   KNEE ARTHROSCOPY Left    SHOULDER ARTHROSCOPY Right    TOTAL KNEE ARTHROPLASTY Right 06/18/2023   Procedure: ARTHROPLASTY, KNEE, TOTAL;  Surgeon: Clarke Frank GRADE, MD;  Location: [facility];  Service: Orthopedics;  Laterality: Right;   Patient Active Problem List   Diagnosis Date Noted   Unilateral primary osteoarthritis, right knee 06/17/2023    PCP: Frank Franchot Mirza, PA-C  REFERRING PROVIDER: Lonni Clarke Vernetta, MD  REFERRING DIAG: [id number] (ICD-10-CM) - Status post total right knee replacement  THERAPY DIAG:  Difficulty in walking, not elsewhere classified  Muscle weakness (generalized)  Localized edema  Stiffness of right knee, not elsewhere classified  Acute pain of right knee  Rationale for Evaluation and Treatment:  Rehabilitation  ONSET DATE: Jun 19, 2023 Rt TKA  SUBJECTIVE:   SUBJECTIVE STATEMENT: Frank Clarke has been doing some sprints with some pain.  Overall, he is doing excellent and working out 4 days a week.  PERTINENT HISTORY: OA, HTN, multiple knee arthroscopies, Right shoulder arthroscopy  PAIN:  NPRS scale: 0-4/10 this week (later in the day and at night) Pain location: Rt knee Pain description: Achy, sore, stiff Aggravating factors: Later in the day, overuse Relieving factors: Tylenol  before bed PRN (less often) and 300 mg Gabapentin  1 x a day  PRECAUTIONS: None and Other: Left knee arthritis  RED FLAGS: None   WEIGHT BEARING RESTRICTIONS: No  FALLS:  Has patient fallen in last 6 months? No  LIVING ENVIRONMENT: Lives with: lives with their family, lives with their spouse, and lives with their daughter Lives in: House/apartment Stairs: Uses handrail and /or a cane Has following equipment at home: Single point cane  OCCUPATION: Scientist, forensic  PLOF: Independent  PATIENT GOALS: Get back to normal driving, work and functional activities  NEXT MD VISIT: 11/10/2023  OBJECTIVE:  Note: Objective measures were completed at Evaluation unless otherwise noted.  DIAGNOSTIC FINDINGS:  2 views of the left knee show significant arthritis of the patellofemoral  joint and osteophytes in all 3 compartments.   2 views of the right knee show significant tricompartment arthritis  especially at the patellofemoral joint.  There are osteophytes in all 3  compartments.  PATIENT SURVEYS:  Patient-Specific Activity Scoring Scheme  0 represents "unable to perform." 10 represents "able to perform at prior level. 0 1 2 3 4 5 6 7 8 9  10 (Date and Score)   Activity Eval  6/19/202530/2025  07/29/2023  1.  Walking 5/10  9/10  2.  Descending stairs 5/10  7/10  3.  Sleeping in a bed 5/10 7/10  4.  Sitting in a chair 8/10 10/10  5.    Score 5.75 8.25   Total score = sum of the  activity scores/number of activities Minimum detectable change (90%CI) for average score = 2 points Minimum detectable change (90%CI) for single activity score = 3 points     COGNITION: 07/08/2023 Overall cognitive status: Within functional limits for tasks assessed     SENSATION: 07/08/2023 [facility]  EDEMA:  07/08/2023 Noted and not objectively assessed   LOWER EXTREMITY ROM & FLEXIBILITY:  Active ROM Right 07/08/2023 Left 07/08/2023 Right 07/21/2023 Left/Right 07/29/2023 Right 08/05/2023 Left/Right 08/23/2023 Right 09/16/2023 Left/Right 10/14/2023  Hip flexion          Hip extension          Hip abduction          Hip adduction          Hip internal rotation          Hip external rotation          Knee flexion 115 133 122 139/127  125 125   Knee extension -5 0 -3 0/-3 -2 0 0   Ankle dorsiflexion          Ankle plantarflexion          Ankle inversion          Ankle eversion          Hamstrings      50/40  40/45  Quadriceps       120/100  120/110  Heel Cords      10/7  11/6   (Blank rows = not tested)  LOWER EXTREMITY STRENGTH:   Left/Right 07/08/2023 dynamometry Left 07/08/2023 dynamometry Right 08/23/2023  Hip flexion     Hip extension     Hip abduction     Hip adduction     Hip internal rotation     Hip external rotation     Knee flexion     Knee extension 64.7 lbs 100 lbs  92.6 lbs  Ankle dorsiflexion     Ankle plantarflexion     Ankle inversion     Ankle eversion      (Blank rows = not tested)  GAIT: 07/19/2023: Independent ambulation c no observed deviations in clinic level surface ambulation.   07/08/2023 Distance walked: 100 feet Assistive device utilized: Single point cane Level of assistance: Complete Independence Comments: Frank Clarke is using the cane outside the house but he mentions he is not using any assistive device inside the house                  TREATMENT         DATE:   10/14/2023 Recumbent bike Seat 8 for 5 minutes Level 5 Supine hamstrings stretch 2 x  20 seconds Standing hamstrings stretch 2 x 20 seconds Supine IT Band stretch with strap 2 x 20 seconds Prone quadriceps stretch with strap 2 x 20 seconds Upper and lower heel cords stretch 2 x 20 seconds each  Hip abduction with pelvic stabilization 2 sets of 5 for 5 seconds  Neuromuscular re-education: Y balance (stand Rt, reach Lt) 5 x  97535: Comprehensive review and update of HEP including recommendations to decrease workouts to 3 x a week from 4   09/23/2023 Recumbent bike Seat 9 for 5 minutes Level 5  Hip abduction with pelvic stabilization 2 sets of 5 for 5 Knee extension machine 35# 15 x slow eccentrics 90-40 degrees Double Leg Heel Raises (up bilateral, down right only) 50 x Quadriceps stretch prone 5 x 20 seconds with belt over opposite shoulder Hamstrings stretch supine with ankle dorsiflexion and strap, other leg straight 5 x 20 seconds Upper and lower heel cord box 1 minute each  Neuromuscular re-education: Y balance (stand Rt, reach Lt and stand Lt, reach Rt) 10 x each Standing single leg reach (stand Rt, reach Lt with 8 targets in a 360 degree range) 5 x  Single-leg ball toss on compliant surface (stand Rt leg) 3 x 60 seconds   09/16/2023 Recumbent bike Seat 9 for 5 minutes Level 5  Hip hike in door frame 10 x 5 seconds Hip hike and push with hip IR 5 x 5 seconds  Neuromuscular re-education: Single leg balance: Eyes open; eyes closed 2 x 20 seconds each side Y balance (stand Rt, reach Lt) 5 x  Standing single leg reach (stand Rt, reach Lt with 6 targets in a 360 degree range)   HOME EXERCISE PROGRAM: Access Code: [email or web address] URL: [email or web address] Date: 10/14/2023 Prepared by: Lamar Ivory  Exercises - Small Range Straight Leg Raise  - 1 x daily - 2-3 x weekly - 8-10 sets - 10 reps - 3 seconds hold - Lateral Step Down (Mirrored)  - 1 x daily - 2-3 x weekly - 2-3 sets - 10 reps - Prone Quadricep Stretch with Strap  - 1-2 x daily - 7 x weekly  - 1 sets - 5 reps - 20 seconds hold - Single Leg Stance  - 1 x daily - 7 x weekly - 1 sets - 10 reps - 20 seconds hold - Standing Hip Hiking  - 1 x daily - 2-3 x weekly - 3 sets - 10 reps - 7-10 seconds hold - Supine ITB Stretch with Strap  - 1-2 x daily - 7 x weekly - 1 sets - 5 reps - 20 seconds hold - Supine Hamstring Stretch  - 1-2 x daily - 7 x weekly - 1 sets - 5 reps - 20 seconds hold  ASSESSMENT:  CLINICAL IMPRESSION: Frank Clarke continues to do very well post-TKA.  He is working out 4 x a week and we discussed dropping to 3 x a week to avoid overuse (some early signs), although it is OK to stretch daily.  He requested 1 additional follow-up before he sees Dr. Vernetta on 11/10/2023.  OBJECTIVE IMPAIRMENTS: Abnormal gait, decreased activity tolerance, decreased endurance, decreased knowledge of condition, difficulty walking, decreased ROM, decreased strength, increased edema, and pain.   ACTIVITY LIMITATIONS: bending, standing, squatting, sleeping, stairs, and locomotion level  PARTICIPATION LIMITATIONS: driving, community activity, and occupation  PERSONAL FACTORS: OA, HTN, multiple knee arthroscopies, Right shoulder arthroscopy are also affecting patient's functional outcome.   REHAB POTENTIAL: Excellent  CLINICAL DECISION MAKING: Stable/uncomplicated  EVALUATION COMPLEXITY: Low   GOALS: Goals reviewed with patient? Yes  SHORT TERM GOALS: Target date: 08/05/2023 Frank Clarke will be independent with his day 1 home exercise program Baseline: Started 07/08/2023 Goal status: Met 07/21/2023  2.  Improve right knee  extension active range of motion to within 3 degrees of neutral Baseline: Within 5 degrees of neutral Goal status: Met 07/21/2023  3.  Improve right knee flexion active range of motion to at least 120 degrees Baseline: 115 degrees Goal status: Met 07/21/2023   LONG TERM GOALS: Target date: 09/02/2023  Improve METS patient specific functional score to at least 7.75 Baseline:  5.75 Goal status: Met 07/29/2023  2.  Frank Clarke will report right knee pain consistently 0-3/10 on the visual analog scale Baseline: 2-5/10 Goal status: Met 07/29/2023  3.  Frank Clarke will have at least 80 pounds of right quadriceps strength as assessed by hand-held dynamometer Baseline: 64.7 pounds Goal status: Met 08/23/2023  4.  Frank Clarke will be able to walk enough to perform his essential job duties and basic ADLs without an assistive device or increasing pain Baseline: Desk duty at work and using a cane outside the home Goal status: Ongoing 10/14/2023  5.  Frank Clarke will be independent with his long-term home maintenance exercise program at discharge Baseline: Started 07/08/2023 Goal status: Ongoing 10/14/2023  PLAN:  PT FREQUENCY: 1-2 additional visits  PT DURATION: Up to 10/17/2023  PLANNED INTERVENTIONS: 97750- Physical Performance Testing, 97110-Therapeutic exercises, 97530- Therapeutic activity, 97112- Neuromuscular re-education, 97535- Self Care, 02859- Manual therapy, [id number]- Gait training, [id number]- Electrical stimulation (unattended), 97016- Vasopneumatic device, Patient/Family education, Balance training, Stair training, Joint mobilization, and Cryotherapy  PLAN FOR NEXT SESSION: Continue higher level Quadriceps strength work (eccentric emphasis to address tendonitis), 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  Will need re-cert for next visit.  DC?  Myer LELON Ivory, PT, MPT 10/14/23  9:33 AM

## 2023-10-20 ENCOUNTER — Ambulatory Visit (INDEPENDENT_AMBULATORY_CARE_PROVIDER_SITE_OTHER): Admitting: Family Medicine

## 2023-10-20 ENCOUNTER — Encounter (INDEPENDENT_AMBULATORY_CARE_PROVIDER_SITE_OTHER): Payer: Self-pay | Admitting: Family Medicine

## 2023-10-20 VITALS — BP 110/73 | HR 72 | Temp 98.2°F | Ht 71.0 in | Wt 255.0 lb

## 2023-10-20 DIAGNOSIS — R7989 Other specified abnormal findings of blood chemistry: Secondary | ICD-10-CM

## 2023-10-20 DIAGNOSIS — I10 Essential (primary) hypertension: Secondary | ICD-10-CM | POA: Diagnosis not present

## 2023-10-20 DIAGNOSIS — R0602 Shortness of breath: Secondary | ICD-10-CM

## 2023-10-20 DIAGNOSIS — Z1331 Encounter for screening for depression: Secondary | ICD-10-CM

## 2023-10-20 DIAGNOSIS — E66812 Obesity, class 2: Secondary | ICD-10-CM

## 2023-10-20 DIAGNOSIS — E559 Vitamin D deficiency, unspecified: Secondary | ICD-10-CM | POA: Diagnosis not present

## 2023-10-20 DIAGNOSIS — Z6835 Body mass index (BMI) 35.0-35.9, adult: Secondary | ICD-10-CM

## 2023-10-20 DIAGNOSIS — R5383 Other fatigue: Secondary | ICD-10-CM | POA: Diagnosis not present

## 2023-10-20 DIAGNOSIS — G4730 Sleep apnea, unspecified: Secondary | ICD-10-CM | POA: Insufficient documentation

## 2023-10-20 DIAGNOSIS — E7800 Pure hypercholesterolemia, unspecified: Secondary | ICD-10-CM

## 2023-10-20 DIAGNOSIS — G4733 Obstructive sleep apnea (adult) (pediatric): Secondary | ICD-10-CM | POA: Diagnosis not present

## 2023-10-20 NOTE — Assessment & Plan Note (Addendum)
 Previously low Vitamin D.  Not on supplementation.  Vitamin D level ordered today.

## 2023-10-20 NOTE — Assessment & Plan Note (Addendum)
 Patient on hydrochlorothiazide 25mg  daily.  Diagnosed around a year ago. Never been on other medications.  BP well controlled today.  No chest pain, chest pressure or headache today.  No dizziness or lightheadedness.  CMP today.

## 2023-10-20 NOTE — Assessment & Plan Note (Addendum)
 Patient previously wore CPAP fairly religiously prior to knee surgery.  He hasn't wore it since surgery due to lack of getting comfortable wearing that and dealing with post op discomfort.  Mild obstructive sleep apnea with AHI of 9.6/hr.  SpO2 low of 87%.  Hasn't seen his sleep provider yet this year.

## 2023-10-20 NOTE — Assessment & Plan Note (Addendum)
 Elevated LDL previously.  Last lab done in January of this year.  Will reorder FLP and plan to risk stratify with new results at next appointment.

## 2023-10-20 NOTE — Progress Notes (Signed)
 Chief Complaint:  Obesity   Subjective:  Frank Clarke (MR# [id number]) is a 43 y.o. male who presents for evaluation and treatment of obesity and related comorbidities.   Frank Clarke is currently in the action stage of change and ready to dedicate time achieving and maintaining a healthier weight. Frank Clarke is interested in becoming our patient and working on intensive lifestyle modifications including (but not limited to) diet and exercise for weight loss.  Frank Clarke has been struggling with his weight. He has been unsuccessful in either losing weight, maintaining weight loss, or reaching his healthy weight goal.  Underwent total knee replacement at the end of May 2025.  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  Occasionally works weekends.  Lives at home with wife Frank Clarke and daughter Frank Clarke (12).  Family is supportive of him, they eat meals together and everyone will be eating healthier.   Patient is not a picky eater and doesn't skip meals.  He tends to snack on nuts and sweets.    Food Recall: English muffin, ham, cheese, butter (2 oz of ham, 1 slice of cheese) and coffee with zero sugar creamer or black. Feels satisfied.  Has a protein bar or nuts mid morning.  Power Crunch or taking out of a container of nuts.  Somewhat hungry at that time. Lunch is low carb wrap with deli meat 8 slices, sauce (1 tbsp), couple handfuls of baby spinach and 2 servings of chips or pretzels.  Drinking diet soda.  Feels satisfied.  Supper is grilled chicken or baked pork chops (1 breast, 1 thick cut pork chop) and vegetable and fruit (1 cup applesauce and 2 cups of vegetables), or ground chicken with zoodles (2-3 cups) feels satisfied.  After dinner is a sweet- baked goods or dark chocolate.   Indirect Calorimeter completed today shows a RMR: 2491.  His calculated basal metabolic rate is 7581 thus his basal metabolic rate is better than expected.  Other Fatigue Frank Clarke admits to daytime  somnolence and admits to waking up still tired. Patient has a history of symptoms of hypertension. Frank Clarke generally gets 6 or 7 hours of sleep per night, and states that he has generally restless sleep. Snoring is present. Apneic episodes are not present. Epworth Sleepiness Score is 4.   Shortness of Breath Frank Clarke notes increasing shortness of breath with exercising and seems to be worsening over time with weight gain. He notes getting out of breath sooner with activity than he used to. This has not gotten worse recently. Frank Clarke denies shortness of breath at rest or orthopnea.  Depression Screen Frank Clarke's Food and Mood (modified PHQ-9) score was 9.     10/20/2023    9:07 AM  Depression screen PHQ 2/9  Decreased Interest 0  Down, Depressed, Hopeless 0  PHQ - 2 Score 0  Altered sleeping 1  Tired, decreased energy 2  Change in appetite 1  Feeling bad or failure about yourself  2  Trouble concentrating 0  Moving slowly or fidgety/restless 0  Suicidal thoughts 0  PHQ-9 Score 6  Difficult doing work/chores Not difficult at all     Objective:  Vitals Temp: 98.2 F (36.8 C) BP: 110/73 Pulse Rate: 72 SpO2: 99 %   Anthropometric Measurements Height: 5' 11 (1.803 m) Weight: 255 lb (115.7 kg) BMI (Calculated): 35.58 Starting Weight: 255 lb Waist Measurement : 45 inches   Body Composition  Body Fat %: 30.8 % Fat Mass (lbs): 78.6 lbs Muscle Mass (lbs): 168.2  lbs Total Body Water  (lbs): 122 lbs Visceral Fat Rating : 15   Other Clinical Data RMR: 2491 Fasting: yes Labs: yes Today's Visit #: 1 Starting Date: 10/20/23 Comments: 1    EKG: Normal sinus rhythm, rate 74.  General: Cooperative, alert, well developed, in no acute distress. HEENT: Conjunctivae and lids unremarkable. Cardiovascular: Regular rhythm.  Lungs: Normal work of breathing. Neurologic: No focal deficits.   Lab Results  Component Value Date   CREATININE 1.16 06/15/2023   BUN 21 (H) 06/15/2023    NA 137 06/15/2023   K 3.9 06/15/2023   CL 100 06/15/2023   CO2 28 06/15/2023   Lab Results  Component Value Date   ALT 96 (H) 01/10/2016   AST 165 (H) 01/10/2016   ALKPHOS 73 01/10/2016   BILITOT 1.1 01/10/2016   No results found for: HGBA1C No results found for: INSULIN No results found for: TSH No results found for: CHOL, HDL, LDLCALC, LDLDIRECT, TRIG, CHOLHDL Lab Results  Component Value Date   WBC 7.1 06/15/2023   HGB 15.5 06/15/2023   HCT 45.0 06/15/2023   MCV 85.7 06/15/2023   PLT 229 06/15/2023   No results found for: IRON, TIBC, FERRITIN  Assessment and Plan:  Assessment & Plan Other fatigue  SOBOE (shortness of breath on exertion)  Depression screening  Primary hypertension Patient on hydrochlorothiazide 25mg  daily.  Diagnosed around a year ago. Never been on other medications.  BP well controlled today.  No chest pain, chest pressure or headache today.  No dizziness or lightheadedness.  CMP today. Obstructive sleep apnea syndrome Patient previously wore CPAP fairly religiously prior to knee surgery.  He hasn't wore it since surgery due to lack of getting comfortable wearing that and dealing with post op discomfort.  Mild obstructive sleep apnea with AHI of 9.6/hr.  SpO2 low of 87%.  Hasn't seen his sleep provider yet this year. Vitamin D deficiency Previously low Vitamin D.  Not on supplementation.  Vitamin D level ordered today. High cholesterol Elevated LDL previously.  Last lab done in January of this year.  Will reorder FLP and plan to risk stratify with new results at next appointment. Class 2 severe obesity with serious comorbidity and body mass index (BMI) of 35.0 to 35.9 in adult, unspecified obesity type  BMI 35.0-35.9,adult  Elevated LFTs LFTs elevated previously.  Will repeat CMP to compare prior labs to now.  Most likely etiology is hepatic steatosis   Other Fatigue  Frank Clarke does feel that his weight is causing his  energy to be lower than it should be. Fatigue may be related to obesity, depression or many other causes. Labs will be ordered, and in the meanwhile, Frank Clarke will focus on self care including making healthy food choices, increasing physical activity and focusing on stress reduction.  Shortness of Breath  Frank Clarke does feel that he gets out of breath more easily that he used to when he exercises. Frank Clarke's shortness of breath appears to be obesity related and exercise induced. He has agreed to work on weight loss and gradually increase exercise to treat his exercise induced shortness of breath. Will continue to monitor closely.   Problem List Items Addressed This Visit   None Visit Diagnoses       Other fatigue    -  Primary     SOBOE (shortness of breath on exertion)         Depression screening         Class 2 severe obesity with serious  comorbidity and body mass index (BMI) of 35.0 to 35.9 in adult, unspecified obesity type           Frank Clarke is currently in the action stage of change and his goal is to continue with weight loss efforts. I recommend Frank Clarke begin the structured treatment plan as follows:  He has agreed to Category 4 Plan  Exercise goals: All adults should avoid inactivity. Some activity is better than none, and adults who participate in any amount of physical activity, gain some health benefits.  Behavioral modification strategies:increasing lean protein intake, decreasing simple carbohydrates, increasing vegetables, meal planning and cooking strategies, keeping healthy foods in the home, and planning for success  He was informed of the importance of frequent follow-up visits to maximize his success with intensive lifestyle modifications for his multiple health conditions. He was informed we would discuss his lab results at his next visit unless there is a critical issue that needs to be addressed sooner. Adelaido agreed to keep his next visit at the agreed upon time to  discuss these results.  Labs ordered with plans to discuss at the next visit.   Attestation Statements:  Reviewed by clinician on day of visit: allergies, medications, problem list, medical history, surgical history, family history, social history, and previous encounter notes.  This is the patient's first visit at [facility]. The patient's NEW PATIENT PACKET was reviewed at length. Included in the packet: current and past health history, medications, allergies, ROS, gynecologic history (women only), surgical history, family history, social history, weight history, weight loss surgery history (for those that have had weight loss surgery), nutritional evaluation, mood and food questionnaire, PHQ9, Epworth questionnaire, sleep habits questionnaire, patient life and health improvement goals questionnaire. These will all be scanned into the patient's chart under media.   During the visit, I independently reviewed the patient's EKG, bioimpedance scale results, and indirect calorimeter results. I used this information to tailor a meal plan for the patient that will help him to lose weight and will improve his obesity-related conditions going forward. I performed a medically necessary appropriate examination and/or evaluation. I discussed the assessment and treatment plan with the patient. The patient was provided an opportunity to ask questions and all were answered. The patient agreed with the plan and demonstrated an understanding of the instructions. Labs were ordered at this visit and will be reviewed at the next visit unless more critical results need to be addressed immediately. Clinical information was updated and documented in the EMR.    Adelita Cho, MD

## 2023-10-20 NOTE — Assessment & Plan Note (Addendum)
 LFTs elevated previously.  Will repeat CMP to compare prior labs to now.  Most likely etiology is hepatic steatosis

## 2023-10-21 ENCOUNTER — Encounter: Admitting: Rehabilitative and Restorative Service Providers"

## 2023-10-21 LAB — COMPREHENSIVE METABOLIC PANEL WITH GFR
ALT: 48 IU/L — ABNORMAL HIGH (ref 0–44)
AST: 37 IU/L (ref 0–40)
Albumin: 4.9 g/dL (ref 4.1–5.1)
Alkaline Phosphatase: 69 IU/L (ref 47–123)
BUN/Creatinine Ratio: 10 (ref 9–20)
BUN: 11 mg/dL (ref 6–24)
Bilirubin Total: 0.7 mg/dL (ref 0.0–1.2)
CO2: 24 mmol/L (ref 20–29)
Calcium: 9.7 mg/dL (ref 8.7–10.2)
Chloride: 98 mmol/L (ref 96–106)
Creatinine, Ser: 1.07 mg/dL (ref 0.76–1.27)
Globulin, Total: 2.6 g/dL (ref 1.5–4.5)
Glucose: 77 mg/dL (ref 70–99)
Potassium: 3.8 mmol/L (ref 3.5–5.2)
Sodium: 140 mmol/L (ref 134–144)
Total Protein: 7.5 g/dL (ref 6.0–8.5)
eGFR: 89 mL/min/1.73 (ref >59)

## 2023-10-21 LAB — CBC WITH DIFFERENTIAL/PLATELET
Basophils Absolute: 0 x10E3/uL (ref 0.0–0.2)
Basos: 1 %
EOS (ABSOLUTE): 0.1 x10E3/uL (ref 0.0–0.4)
Eos: 2 %
Hematocrit: 46.6 % (ref 37.5–51.0)
Hemoglobin: 15.4 g/dL (ref 13.0–17.7)
Immature Grans (Abs): 0 x10E3/uL (ref 0.0–0.1)
Immature Granulocytes: 0 %
Lymphocytes Absolute: 1.6 x10E3/uL (ref 0.7–3.1)
Lymphs: 28 %
MCH: 28.9 pg (ref 26.6–33.0)
MCHC: 33 g/dL (ref 31.5–35.7)
MCV: 88 fL (ref 79–97)
Monocytes Absolute: 0.5 x10E3/uL (ref 0.1–0.9)
Monocytes: 8 %
Neutrophils Absolute: 3.5 x10E3/uL (ref 1.4–7.0)
Neutrophils: 60 %
Platelets: 255 x10E3/uL (ref 150–450)
RBC: 5.32 x10E6/uL (ref 4.14–5.80)
RDW: 13.1 % (ref 11.6–15.4)
WBC: 5.7 x10E3/uL (ref 3.4–10.8)

## 2023-10-21 LAB — T3: T3, Total: 145 ng/dL (ref 71–180)

## 2023-10-21 LAB — LIPID PANEL WITH LDL/HDL RATIO
Cholesterol, Total: 188 mg/dL (ref 100–199)
HDL: 36 mg/dL — ABNORMAL LOW (ref >39)
LDL Chol Calc (NIH): 118 mg/dL — ABNORMAL HIGH (ref 0–99)
LDL/HDL Ratio: 3.3 ratio (ref 0.0–3.6)
Triglycerides: 192 mg/dL — ABNORMAL HIGH (ref 0–149)
VLDL Cholesterol Cal: 34 mg/dL (ref 5–40)

## 2023-10-21 LAB — HEMOGLOBIN A1C
Est. average glucose Bld gHb Est-mCnc: 105 mg/dL
Hgb A1c MFr Bld: 5.3 % (ref 4.8–5.6)

## 2023-10-21 LAB — FOLATE: Folate: 8.7 ng/mL (ref >3.0)

## 2023-10-21 LAB — VITAMIN D 25 HYDROXY (VIT D DEFICIENCY, FRACTURES): Vit D, 25-Hydroxy: 46.6 ng/mL (ref 30.0–100.0)

## 2023-10-21 LAB — INSULIN, RANDOM: INSULIN: 11.6 u[IU]/mL (ref 2.6–24.9)

## 2023-10-21 LAB — T4, FREE: Free T4: 1.24 ng/dL (ref 0.82–1.77)

## 2023-10-21 LAB — VITAMIN B12: Vitamin B-12: 434 pg/mL (ref 232–1245)

## 2023-10-21 LAB — TSH: TSH: 1.31 u[IU]/mL (ref 0.450–4.500)

## 2023-10-28 ENCOUNTER — Encounter: Admitting: Rehabilitative and Restorative Service Providers"

## 2023-11-03 ENCOUNTER — Encounter (INDEPENDENT_AMBULATORY_CARE_PROVIDER_SITE_OTHER): Payer: Self-pay | Admitting: Family Medicine

## 2023-11-03 ENCOUNTER — Ambulatory Visit (INDEPENDENT_AMBULATORY_CARE_PROVIDER_SITE_OTHER): Admitting: Family Medicine

## 2023-11-03 VITALS — BP 110/71 | HR 76 | Temp 98.4°F | Ht 71.0 in | Wt 251.0 lb

## 2023-11-03 DIAGNOSIS — E785 Hyperlipidemia, unspecified: Secondary | ICD-10-CM

## 2023-11-03 DIAGNOSIS — R7989 Other specified abnormal findings of blood chemistry: Secondary | ICD-10-CM | POA: Diagnosis not present

## 2023-11-03 DIAGNOSIS — I10 Essential (primary) hypertension: Secondary | ICD-10-CM

## 2023-11-03 DIAGNOSIS — E66812 Obesity, class 2: Secondary | ICD-10-CM

## 2023-11-03 DIAGNOSIS — E559 Vitamin D deficiency, unspecified: Secondary | ICD-10-CM

## 2023-11-03 DIAGNOSIS — G4733 Obstructive sleep apnea (adult) (pediatric): Secondary | ICD-10-CM

## 2023-11-03 DIAGNOSIS — Z6835 Body mass index (BMI) 35.0-35.9, adult: Secondary | ICD-10-CM

## 2023-11-03 MED ORDER — HYDROCHLOROTHIAZIDE 25 MG PO TABS: 12.5000 mg | ORAL_TABLET | Freq: Every day | ORAL | Status: DC

## 2023-11-03 NOTE — Assessment & Plan Note (Signed)
 Discussed importance of vitamin d supplementation.  Vitamin d supplementation has been shown to decrease fatigue, decrease risk of progression to insulin resistance and then prediabetes, decreases risk of falling in older age and can even assist in decreasing depressive symptoms in PTSD.   Prescription for Vitamin D sent in.

## 2023-11-03 NOTE — Progress Notes (Unsigned)
 SUBJECTIVE:  Chief Complaint: Obesity  Interim History: patient voices his first two weeks went really well.  He felt it was quite a bit of meat and protein and ended up moving his protein to lunch and dinner.  He felt overly stuffed for a few days then started feeling hungry.  He may have added 30-50 calories a day for snack calories.  Brought a list of questions today. Wants to know about incorporation of lean ground meat, substituting egg whites for eggs and incorporating oil for roasting vegetables. No upcoming plans for events, activities or travel.   Frank Clarke is here to discuss his progress with his obesity treatment plan. He is on the Category 4 Plan and states he is following his eating plan approximately 98 % of the time. He states he is exercising 30 minutes 3-4 times per week.   OBJECTIVE: Visit Diagnoses: Problem List Items Addressed This Visit       Cardiovascular and Mediastinum   Hypertension   Relevant Medications   hydrochlorothiazide (HYDRODIURIL) 25 MG tablet     Respiratory   Sleep apnea - Primary     Other   Vitamin D deficiency   Elevated LFTs   Other Visit Diagnoses       Hyperlipidemia, unspecified hyperlipidemia type       Relevant Medications   hydrochlorothiazide (HYDRODIURIL) 25 MG tablet     Class 2 severe obesity with serious comorbidity and body mass index (BMI) of 35.0 to 35.9 in adult, unspecified obesity type         BMI 35.0-35.9,adult           Vitals Temp: 98.4 F (36.9 C) BP: 110/71 Pulse Rate: 76 SpO2: 97 %   Anthropometric Measurements Height: 5' 11 (1.803 m) Weight: 251 lb (113.9 kg) BMI (Calculated): 35.02 Weight at Last Visit: 255 lb Weight Lost Since Last Visit: 4 Weight Gained Since Last Visit: 0 Starting Weight: 255 lb Total Weight Loss (lbs): 4 lb (1.814 kg)   Body Composition  Body Fat %: 29.5 % Fat Mass (lbs): 74.2 lbs Muscle Mass (lbs): 168.6 lbs Total Body Water  (lbs): 122.2 lbs Visceral Fat Rating :  15   Other Clinical Data Today's Visit #: 2 Starting Date: 10/20/23 Comments: Cat 4     ASSESSMENT AND PLAN: Assessment & Plan Obstructive sleep apnea syndrome  Elevated LFTs  Vitamin D deficiency Discussed importance of vitamin d supplementation.  Vitamin d supplementation has been shown to decrease fatigue, decrease risk of progression to insulin resistance and then prediabetes, decreases risk of falling in older age and can even assist in decreasing depressive symptoms in PTSD.   Prescription for Vitamin D sent in.   Hyperlipidemia, unspecified hyperlipidemia type The 10-year ASCVD risk score (Arnett DK, et al., 2019) is: 1.8%   Values used to calculate the score:     Age: 43 years     Clincally relevant sex: Male     Is Non-Hispanic African American: No     Diabetic: No     Tobacco smoker: No     Systolic Blood Pressure: 110 mmHg     Is BP treated: Yes     HDL Cholesterol: 36 mg/dL     Total Cholesterol: 188 mg/dL  Class 2 severe obesity with serious comorbidity and body mass index (BMI) of 35.0 to 35.9 in adult, unspecified obesity type  BMI 35.0-35.9,adult  Primary hypertension Blood pressure well controlled today.  He is on hydrochlorothiazide 25mg  daily.  No chest pain,  chest pressure or headache.  Will decrease hydrochlorothiazide to 12.5mg  daily.  Follow up on BP at next appointment.   Diet: Frank Clarke is currently in the action stage of change. As such, his goal is to continue with weight loss efforts and has agreed to the Category 4 Plan.   Exercise:  All adults should avoid inactivity. Some activity is better than none, and adults who participate in any amount of physical activity, gain some health benefits.  Behavior Modification:  We discussed the following Behavioral Modification Strategies today: increasing lean protein intake, decreasing simple carbohydrates, increasing vegetables, meal planning and cooking strategies, and planning for  success.   Return in about 3 weeks (around 11/24/2023).   He was informed of the importance of frequent follow up visits to maximize his success with intensive lifestyle modifications for his multiple health conditions.  Attestation Statements:   Reviewed by clinician on day of visit: allergies, medications, problem list, medical history, surgical history, family history, social history, and previous encounter notes.     Adelita Cho, MD

## 2023-11-03 NOTE — Assessment & Plan Note (Signed)
 Blood pressure well controlled today.  He is on hydrochlorothiazide 25mg  daily.  No chest pain, chest pressure or headache.  Will decrease hydrochlorothiazide to 12.5mg  daily.  Follow up on BP at next appointment.

## 2023-11-04 ENCOUNTER — Ambulatory Visit: Admitting: Orthopaedic Surgery

## 2023-11-04 NOTE — Assessment & Plan Note (Signed)
 ALT elevated on initial labs.  Previous LFTs labs were from 7 years ago.  Unknown etiology.  Will continue to monitor with follow up labs in 3-4 months.  Most common cause of LFT elevation hepatic steatosis and we discussed that weight loss through lifestyle changes and possible pharmacologic intervention will decrease severity of steatosis.

## 2023-11-05 ENCOUNTER — Encounter: Payer: Self-pay | Admitting: Rehabilitative and Restorative Service Providers"

## 2023-11-05 ENCOUNTER — Ambulatory Visit: Admitting: Rehabilitative and Restorative Service Providers"

## 2023-11-05 DIAGNOSIS — R600 Localized edema: Secondary | ICD-10-CM

## 2023-11-05 DIAGNOSIS — M25661 Stiffness of right knee, not elsewhere classified: Secondary | ICD-10-CM

## 2023-11-05 DIAGNOSIS — M25561 Pain in right knee: Secondary | ICD-10-CM

## 2023-11-05 DIAGNOSIS — R262 Difficulty in walking, not elsewhere classified: Secondary | ICD-10-CM | POA: Diagnosis not present

## 2023-11-05 DIAGNOSIS — M6281 Muscle weakness (generalized): Secondary | ICD-10-CM

## 2023-11-05 NOTE — Therapy (Signed)
 OUTPATIENT PHYSICAL THERAPY TREATMENT/DISCHARGE NOTE  PHYSICAL THERAPY DISCHARGE SUMMARY  Visits from Start of Care: 11  Current functional level related to goals / functional outcomes: See note   Remaining deficits: See note   Education / Equipment: Updated HEP    Patient agrees to discharge. Patient goals were met. Patient is being discharged due to being pleased with the current functional level.     Patient Name: TENZIN EDELMAN MRN: [id number] DOB:[birth year], 43 y.o., male Today's Date: 11/05/2023  END OF SESSION:  PT End of Session - 11/05/23 0843     Visit Number 11    Number of Visits 16    Date for Recertification  11/05/23    Authorization Type [employer]    Progress Note Due on Visit 10    PT Start Time 0843    PT Stop Time 0926    PT Time Calculation (min) 43 min    Activity Tolerance Patient tolerated treatment well;No increased pain    Behavior During Therapy [facility] for tasks assessed/performed             Past Medical History:  Diagnosis Date   Arthritis    Dental crowns present    Environmental allergies    Heart murmur    High cholesterol    Hypertension    Irritable bowel    Joint pain    Osteoarthritis    Perianal abscess 10/2016   Sinus congestion 10/29/2016   Sleep apnea    cpap   Vitamin D deficiency    Past Surgical History:  Procedure Laterality Date   ASD REPAIR     age 58   CHOLECYSTECTOMY     EVALUATION UNDER ANESTHESIA WITH FISTULECTOMY Left 11/04/2016   Procedure: EXAM UNDER ANESTHESIA WITH FISTULOTOMY;  Surgeon: Vernetta Berg, MD;  Location: [facility];  Service: General;  Laterality: Left;   KNEE ARTHROSCOPY Right    x 2   KNEE ARTHROSCOPY Left    SHOULDER ARTHROSCOPY Right    TOTAL KNEE ARTHROPLASTY Right 06/18/2023   Procedure: ARTHROPLASTY, KNEE, TOTAL;  Surgeon: Vernetta Lonni GRADE, MD;  Location: [facility];  Service: Orthopedics;  Laterality: Right;   Patient Active Problem List   Diagnosis Date  Noted   Elevated LFTs 10/20/2023   Hypertension    Sleep apnea    Vitamin D deficiency    High cholesterol    Unilateral primary osteoarthritis, right knee 06/17/2023    PCP: Camie Franchot Mirza, PA-C  REFERRING PROVIDER: Lonni GRADE Vernetta, MD  REFERRING DIAG: [phone] (ICD-10-CM) - Status post total right knee replacement  THERAPY DIAG:  Difficulty in walking, not elsewhere classified - Plan: PT plan of care cert/re-cert  Muscle weakness (generalized) - Plan: PT plan of care cert/re-cert  Localized edema - Plan: PT plan of care cert/re-cert  Stiffness of right knee, not elsewhere classified - Plan: PT plan of care cert/re-cert  Acute pain of right knee - Plan: PT plan of care cert/re-cert  Rationale for Evaluation and Treatment: Rehabilitation  ONSET DATE: Jun 19, 2023 Rt TKA  SUBJECTIVE:   SUBJECTIVE STATEMENT: [name] has been lifting weights, climbing stairs and stretching with little problems.    PERTINENT HISTORY: OA, HTN, multiple knee arthroscopies, Right shoulder arthroscopy  PAIN:  NPRS scale: 0-4/10 this week (precautionary Tylenol  at night) Pain location: Rt knee Pain description: Stiff in the AM Aggravating factors: Overuse Relieving factors: Tylenol  before bed PRN (less often)  PRECAUTIONS: None and Other: Left knee arthritis  RED FLAGS: None  WEIGHT BEARING RESTRICTIONS: No  FALLS:  Has patient fallen in last 6 months? No  LIVING ENVIRONMENT: Lives with: lives with their family, lives with their spouse, and lives with their daughter Lives in: House/apartment Stairs: Uses handrail and /or a cane Has following equipment at home: Single point cane  OCCUPATION: Scientist, forensic  PLOF: Independent  PATIENT GOALS: Get back to normal driving, work and functional activities  NEXT MD VISIT: 11/10/2023  OBJECTIVE:  Note: Objective measures were completed at Evaluation unless otherwise noted.  DIAGNOSTIC FINDINGS:  2 views of the left  knee show significant arthritis of the patellofemoral  joint and osteophytes in all 3 compartments.   2 views of the right knee show significant tricompartment arthritis  especially at the patellofemoral joint.  There are osteophytes in all 3  compartments.  PATIENT SURVEYS:  Patient-Specific Activity Scoring Scheme  0 represents "unable to perform." 10 represents "able to perform at prior level. 0 1 2 3 4 5 6 7 8 9  10 (Date and Score)   Activity Eval  6/19/202530/2025  07/29/2023 11/05/2023  1.  Walking 5/10  9/10 10  2.  Descending stairs 5/10  7/10 9  3.  Sleeping in a bed 5/10 7/10 10  4.  Sitting in a chair 8/10 10/10 10  5.     Score 5.75 8.25 9.75   Total score = sum of the activity scores/number of activities Minimum detectable change (90%CI) for average score = 2 points Minimum detectable change (90%CI) for single activity score = 3 points     COGNITION: 07/08/2023 Overall cognitive status: Within functional limits for tasks assessed     SENSATION: 07/08/2023 [facility]  EDEMA:  07/08/2023 Noted and not objectively assessed   LOWER EXTREMITY ROM & FLEXIBILITY:  Active ROM Right 07/08/2023 Left 07/08/2023 Right 07/21/2023 Left/Right 07/29/2023 Right 08/05/2023 Left/Right 08/23/2023 Right 09/16/2023 Left/Right 10/14/2023 Left/Right 11/05/2023  Hip flexion           Hip extension           Hip abduction           Hip adduction           Hip internal rotation           Hip external rotation           Knee flexion 115 133 122 139/127  125 125  125  Knee extension -5 0 -3 0/-3 -2 0 0  0  Ankle dorsiflexion           Ankle plantarflexion           Ankle inversion           Ankle eversion           Hamstrings      50/40  40/45 50/45  Quadriceps       120/100  120/110 120/115  Heel Cords      10/7  11/6 11/9   (Blank rows = not tested)  LOWER EXTREMITY STRENGTH:   Left/Right 07/08/2023 dynamometry Left 07/08/2023 dynamometry Right 08/23/2023  Hip flexion     Hip  extension     Hip abduction     Hip adduction     Hip internal rotation     Hip external rotation     Knee flexion     Knee extension 64.7 lbs 100 lbs  92.6 lbs  Ankle dorsiflexion     Ankle plantarflexion     Ankle inversion  Ankle eversion      (Blank rows = not tested)  GAIT: 07/19/2023: Independent ambulation c no observed deviations in clinic level surface ambulation.   07/08/2023 Distance walked: 100 feet Assistive device utilized: Single point cane Level of assistance: Complete Independence Comments: [name] is using the cane outside the house but he mentions he is not using any assistive device inside the house                  TREATMENT         DATE:   11/05/2023 Recumbent bike Seat 8 for 5 minutes Level 5 Supine hamstrings stretch 2 x 20 seconds Supine IT Band stretch with strap 2 x 20 seconds Prone quadriceps stretch with strap 2 x 20 seconds Hip abduction with pelvic stabilization 2 sets of 5 for 5 seconds  Neuromuscular re-education: Y balance (stand Rt, reach Lt) 5 x  Functional Activities: Step-down off 8 inch step 10 x bilateral Running gait analysis  97535: Comprehensive review and update of HEP including recommendations to decrease weight-bearing strength workouts to 2-3 x a week   10/14/2023 Recumbent bike Seat 8 for 5 minutes Level 5 Supine hamstrings stretch 2 x 20 seconds Standing hamstrings stretch 2 x 20 seconds Supine IT Band stretch with strap 2 x 20 seconds Prone quadriceps stretch with strap 2 x 20 seconds Upper and lower heel cords stretch 2 x 20 seconds each Hip abduction with pelvic stabilization 2 sets of 5 for 5 seconds  Neuromuscular re-education: Y balance (stand Rt, reach Lt) 5 x  97535: Comprehensive review and update of HEP including recommendations to decrease workouts to 3 x a week from 4   09/23/2023 Recumbent bike Seat 9 for 5 minutes Level 5  Hip abduction with pelvic stabilization 2 sets of 5 for 5 Knee extension  machine 35# 15 x slow eccentrics 90-40 degrees Double Leg Heel Raises (up bilateral, down right only) 50 x Quadriceps stretch prone 5 x 20 seconds with belt over opposite shoulder Hamstrings stretch supine with ankle dorsiflexion and strap, other leg straight 5 x 20 seconds Upper and lower heel cord box 1 minute each  Neuromuscular re-education: Y balance (stand Rt, reach Lt and stand Lt, reach Rt) 10 x each Standing single leg reach (stand Rt, reach Lt with 8 targets in a 360 degree range) 5 x  Single-leg ball toss on compliant surface (stand Rt leg) 3 x 60 seconds   HOME EXERCISE PROGRAM: Access Code: [email or web address] URL: [email or web address] Date: 11/05/2023 Prepared by: Lamar Ivory  Exercises - Small Range Straight Leg Raise  - 1 x daily - 2-3 x weekly - 8-10 sets - 10 reps - 3 seconds hold - Lateral Step Down (Mirrored)  - 1 x daily - 2-3 x weekly - 3-5 sets - 10 reps - Prone Quadricep Stretch with Strap  - 1 x daily - 7 x weekly - 1 sets - 5 reps - 20 seconds hold - Single Leg Stance  - 1 x daily - 2-3 x weekly - 1 sets - 10 reps - 20 seconds hold - Standing Hip Hiking  - 1 x daily - 2-3 x weekly - 3 sets - 10 reps - 7-10 seconds hold - Supine ITB Stretch with Strap  - 1 x daily - 7 x weekly - 1 sets - 5 reps - 20 seconds hold - Supine Hamstring Stretch  - 1 x daily - 7 x weekly - 1 sets - 5 reps - 20 seconds hold  ASSESSMENT:  CLINICAL IMPRESSION: [name] continues to excel post-TKA.  He is working out multiple times a week and we discussed recommendations to limit weightbearing strengthening activities to 2 to 3 days a week to avoid overuse, although it is OK to stretch daily.  We updated [name]'s home exercise program and he expressed confidence with the idea of transfer into independent rehabilitation.  OBJECTIVE IMPAIRMENTS: Abnormal gait, decreased activity tolerance, decreased endurance, decreased knowledge of condition, difficulty walking, decreased ROM, decreased  strength, increased edema, and pain.   ACTIVITY LIMITATIONS: bending, standing, squatting, sleeping, stairs, and locomotion level  PARTICIPATION LIMITATIONS: driving, community activity, and occupation  PERSONAL FACTORS: OA, HTN, multiple knee arthroscopies, Right shoulder arthroscopy are also affecting patient's functional outcome.   REHAB POTENTIAL: Excellent  CLINICAL DECISION MAKING: Stable/uncomplicated  EVALUATION COMPLEXITY: Low   GOALS: Goals reviewed with patient? Yes  SHORT TERM GOALS: Target date: 08/05/2023 [name] will be independent with his day 1 home exercise program Baseline: Started 07/08/2023 Goal status: Met 07/21/2023  2.  Improve right knee extension active range of motion to within 3 degrees of neutral Baseline: Within 5 degrees of neutral Goal status: Met 07/21/2023  3.  Improve right knee flexion active range of motion to at least 120 degrees Baseline: 115 degrees Goal status: Met 07/21/2023   LONG TERM GOALS: Target date: 11/05/2023  Improve METS patient specific functional score to at least 7.75 Baseline: 5.75 Goal status: Met 07/29/2023  2.  [name] will report right knee pain consistently 0-3/10 on the visual analog scale Baseline: 2-5/10 Goal status: Met 07/29/2023  3.  [name] will have at least 80 pounds of right quadriceps strength as assessed by hand-held dynamometer Baseline: 64.7 pounds Goal status: Met 08/23/2023  4.  [name] will be able to walk enough to perform his essential job duties and basic ADLs without an assistive device or increasing pain Baseline: Desk duty at work and using a cane outside the home Goal status: Met 11/05/2023  5.  [name] will be independent with his long-term home maintenance exercise program at discharge Baseline: Started 07/08/2023 Goal status: Met 11/05/2023  PLAN:  PT FREQUENCY: DC  PT DURATION: DC  PLANNED INTERVENTIONS: 97750- Physical Performance Testing, 97110-Therapeutic exercises, 97530- Therapeutic activity,  97112- Neuromuscular re-education, 97535- Self Care, 02859- Manual therapy, [phone]- Gait training, [phone]- Electrical stimulation (unattended), 97016- Vasopneumatic device, Patient/Family education, Balance training, Stair training, Joint mobilization, and Cryotherapy  PLAN FOR NEXT SESSION: DC today  Myer LELON Ivory, PT, MPT 11/05/23  9:40 AM

## 2023-11-10 ENCOUNTER — Ambulatory Visit (INDEPENDENT_AMBULATORY_CARE_PROVIDER_SITE_OTHER): Admitting: Orthopaedic Surgery

## 2023-11-10 ENCOUNTER — Encounter: Payer: Self-pay | Admitting: Orthopaedic Surgery

## 2023-11-10 DIAGNOSIS — Z96651 Presence of right artificial knee joint: Secondary | ICD-10-CM | POA: Diagnosis not present

## 2023-11-10 NOTE — Progress Notes (Signed)
 [name] is here at 5 months status post a right press-fit total knee arthroplasty to treat significant right knee arthritis in someone who is only 43 years old but incredibly active with a history of knee arthroscopies and knee injuries in the past.  He is also a Hydrographic surveyor.  He feels that he is ready to resume full work duties without restrictions.  He has been on light duty.  He says he has no issues going up and down stairs and reports good range of motion and strength.  He denies any symptoms of instability.  On exam there is still some swelling to be expected with his right knee but overall the incision looks good.  The range of motion of that knee looks good and it feels stable on exam.  From our standpoint the next time to see him is in 6 months.  Will have a standing AP and lateral of his right knee at that visit.  If there are issues before then he knows to absolutely reach out and let us  know

## 2023-11-22 ENCOUNTER — Encounter: Payer: Self-pay | Admitting: Radiology

## 2023-11-29 NOTE — Progress Notes (Unsigned)
 SUBJECTIVE: Discussed the use of AI scribe software for clinical note transcription with the patient, who gave verbal consent to proceed.  Chief Complaint: Obesity  Interim History: He is down 2 lbs since his last visit.  Down 6 lbs overall TBW loss of 2.4% over the past month.   Frank Clarke is here to discuss his progress with his obesity treatment plan. He is on the Category 4 Plan and states he is following his eating plan approximately 90 % of the time. He states he is exercising cardio 45-60 minutes 3-4 times per week. Underwent R TKR 06/18/23- Dr JAYSON Poli- Now cleared for full activities.   The patient is a 43 year old with obesity, obstructive sleep apnea, hyperlipidemia, and hypertension who presents for follow-up of his obesity treatment plan.  He is adhering to his dietary plan, which has become routine, although he finds the required protein intake challenging. He supplements meals with additional protein sources like rotisserie chicken. He has had a slight loss of muscle mass, attributed to a recent decrease in physical activity due to a new puppy requiring midday care when he normally got in a work out.  Cravings for salty, cheesy, and crunchy foods in the evenings are satisfied with 5-6 small cheesy rice cakes.  He underwent a right total knee replacement at the end of May 2025 and reports that his recovery has been progressing. He has been cleared by orthopedics to return to full activity and work. However, he has not been able to resume weight lifting due to the new puppy, which has affected his muscle mass maintenance. We discussed strategies to add in some weight training- He has a weighted vest and may consider using this on his walks with the puppy.   He is currently taking hydrochlorothiazide 12.5 mg daily for hypertension, over-the-counter cholecalciferol 5000 IU daily, and medications for chronic allergies. His energy levels have improved since changing his diet, noting  that he no longer experiences afternoon drowsiness.  He experiences difficulty sleeping due to body aches and uses the late evening as personal time, which affects his sleep duration. We discussed strategies to help with sleep patterns.   He has a history of elevated liver function tests, vitamin D deficiency, and mild insulin resistance. He is currently not on medication for hyperlipidemia, as his treatment plan is being evaluated based on dietary changes.  His family history includes a possible stroke in his grandfather, but no significant history of heart disease or stroke. 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 Reports adherence to dietary plan with increased protein intake. Experiencing challenges with exercise due to new puppy responsibilities.  - Continue current dietary plan with emphasis on protein intake. - Incorporate weight training exercises as feasible. - Consider using a weighted vest during  walks with the dog.   Insulin Resistance Last fasting insulin was 11.6- not at goal. A1c was 5.3- at goal. Polyphagia:No Medication(s): None Insulin level slightly elevated, but A1c is within normal range.   His HOMA-IR is 2.2 which is mildly elevated. Optimal level < 1.9. This is complex condition associated with genetics, ectopic fat and lifestyle factors. Insulin resistance may result in weight gain,  abnormal cravings (particularly for carbs) and fatigue. This may result in additional weight gain and lead to pre-diabetes and diabetes if untreated.   Lab Results  Component Value Date   HGBA1C 5.3 10/20/2023   Lab Results  Component Value Date   INSULIN 11.6 10/20/2023   Lab Results  Component Value Date   GLUCOSE 77 10/20/2023   GLUCOSE 98 01/10/2016    Plan: Treatment options which include losing 7 to 10% of body weight, increasing physical activity to a 150 minutes a week of moderate intensity.He may also be a candidate for pharmacoprophylaxis with metformin or incretin mimetic. He has noticed some improvements in energy levels following the plan already.   Continue working on nutrition plan to decrease simple carbohydrates, increase lean proteins and exercise to promote weight loss, improve glycemic control and prevent progression to Type 2 diabetes.  - Monitor insulin levels and A1c Recheck late Dec.   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 Recovery progressing well, with return to full duty at work. Cleared by orthopedics for full activity. Experiencing some limitations in exercise due to new puppy responsibilities. - Continue gradual return to exercise, including weight training. - Consider using a weighted vest during walks to aid with muscle maintenance/muscle mass gain  Hypertension Managed with hydrochlorothiazide previously. Off medication currently.  BP Readings from Last 3 Encounters:  11/30/23 109/71  11/03/23 110/71  10/20/23 110/73   Lab Results  Component Value Date   NA 140 10/20/2023   CL 98 10/20/2023   K 3.8 10/20/2023   CO2 24 10/20/2023   BUN 11 10/20/2023   CREATININE 1.07 10/20/2023   EGFR 89 10/20/2023   CALCIUM 9.7 10/20/2023   ALBUMIN 4.9 10/20/2023   GLUCOSE 77 10/20/2023   Plan;  Continue to work on nutrition plan to promote weight loss and improve BP control- currently well  controlled.  All in excellent range, so may not need BP medication and will continue to monitor.   Hyperlipidemia  No current medication for elevated triglycerides/LDL  and low HDL cholesterol.  Coronary calcium score is zero, indicating low cardiovascular risk.  Lab Results  Component Value Date   CHOL 188 10/20/2023   Lab Results  Component Value Date   HDL 36 (L) 10/20/2023   Lab Results  Component Value Date   LDLCALC 118 (H) 10/20/2023   Lab Results  Component Value Date   TRIG 192 (H) 10/20/2023  The 10-year ASCVD risk score (Arnett DK, et al., 2019) is: 1.8%   Values used to calculate the score:     Age: 89 years     Clincally relevant sex: Male     Is Non-Hispanic African American: No     Diabetic: No     Tobacco smoker: No     Systolic Blood Pressure: 109 mmHg     Is BP treated: Yes     HDL Cholesterol: 36 mg/dL     Total Cholesterol: 188 mg/dL  No results found for: CHOLHDL No results found for: LDLDIRECT;i Hyperlipidemia, currently not on medication/no statin  therapy indicated . Awaiting results of new meal plan to assess impact on lipid levels. Coronary calcium score is zero, indicating low cardiovascular risk. - Continue to monitor lipid levels with upcoming fasting labs in late Dec.   Vitamin D deficiency Managed with cholecalciferol 5000 IU daily. Goal is to maintain levels between 50-70 ng/mL to reduce risk of infections and certain cancers. - Continue cholecalciferol 5000 IU daily. - Will monitor vitamin D levels periodically to optimize supplementation/avoid over supplementation.  Low serum B 12  Lab Results  Component Value Date   VITAMINB12 434 10/20/2023    Discussed potential benefits of B12 supplementation for energy levels and cell function. Plan:  - Consider B12 500 mcg daily supplementation for energy levels. Monitor B 12 levels periodically.  Vitals Temp: 97.8 F (36.6 C) BP: 109/71 Pulse Rate: 66 SpO2: 98 %   Anthropometric  Measurements Height: 5' 11 (1.803 m) Weight: 249 lb (112.9 kg) BMI (Calculated): 34.74 Weight at Last Visit: 251 lb Weight Lost Since Last Visit: 2 lb Weight Gained Since Last Visit: 0 Starting Weight: 255 lb Total Weight Loss (lbs): 6 lb (2.722 kg) Peak Weight: 280 lb   Body Composition  Body Fat %: 30.7 % Fat Mass (lbs): 76.4 lbs Muscle Mass (lbs): 164.2 lbs Total Body Water  (lbs): 123 lbs Visceral Fat Rating : 15   Other Clinical Data Today's Visit #: 3 Starting Date: 10/20/23     ASSESSMENT AND PLAN:  Diet: Frank Clarke is currently in the action stage of change. As such, his goal is to continue with weight loss efforts. He has agreed to Category 4 Plan.  Exercise: Frank Clarke has been instructed to work up to a goal of 150 minutes of combined cardio and strengthening exercise per week for weight loss and overall health benefits.   Behavior Modification:  We discussed the following Behavioral Modification Strategies today: increasing lean protein intake, decreasing simple carbohydrates, increasing vegetables, increase H2O intake, increase high fiber foods, meal planning and cooking strategies, holiday eating strategies, avoiding temptations, and planning for success. We discussed various medication options to help Frank Clarke with his weight loss efforts and we both agreed to continue current treatment plan.  Return in about 4 weeks (around 12/28/2023).SABRA He was informed of the importance of frequent follow up visits to maximize his success with intensive lifestyle modifications for his multiple health conditions.  Attestation Statements:   Reviewed by clinician on day of visit: allergies, medications, problem list, medical history, surgical history, family history, social history, and previous encounter notes.   Time spent on visit including pre-visit chart review and post-visit care and charting was 41 minutes.    Elijan Googe, PA-C

## 2023-11-30 ENCOUNTER — Ambulatory Visit (INDEPENDENT_AMBULATORY_CARE_PROVIDER_SITE_OTHER): Payer: Self-pay | Admitting: Physician Assistant

## 2023-11-30 ENCOUNTER — Encounter (INDEPENDENT_AMBULATORY_CARE_PROVIDER_SITE_OTHER): Payer: Self-pay | Admitting: Physician Assistant

## 2023-11-30 VITALS — BP 109/71 | HR 66 | Temp 97.8°F | Ht 71.0 in | Wt 249.0 lb

## 2023-11-30 DIAGNOSIS — Z6834 Body mass index (BMI) 34.0-34.9, adult: Secondary | ICD-10-CM

## 2023-11-30 DIAGNOSIS — E88819 Insulin resistance, unspecified: Secondary | ICD-10-CM

## 2023-11-30 DIAGNOSIS — E559 Vitamin D deficiency, unspecified: Secondary | ICD-10-CM

## 2023-11-30 DIAGNOSIS — E785 Hyperlipidemia, unspecified: Secondary | ICD-10-CM | POA: Diagnosis not present

## 2023-11-30 DIAGNOSIS — E538 Deficiency of other specified B group vitamins: Secondary | ICD-10-CM

## 2023-11-30 DIAGNOSIS — G4733 Obstructive sleep apnea (adult) (pediatric): Secondary | ICD-10-CM

## 2023-11-30 DIAGNOSIS — I10 Essential (primary) hypertension: Secondary | ICD-10-CM | POA: Diagnosis not present

## 2023-11-30 DIAGNOSIS — R7989 Other specified abnormal findings of blood chemistry: Secondary | ICD-10-CM

## 2023-11-30 DIAGNOSIS — E669 Obesity, unspecified: Secondary | ICD-10-CM

## 2023-12-27 ENCOUNTER — Other Ambulatory Visit: Payer: Self-pay | Admitting: Physician Assistant

## 2023-12-27 MED ORDER — GABAPENTIN 300 MG PO CAPS: 300.0000 mg | ORAL_CAPSULE | Freq: Two times a day (BID) | ORAL | 1 refills | Status: AC

## 2023-12-29 ENCOUNTER — Encounter (INDEPENDENT_AMBULATORY_CARE_PROVIDER_SITE_OTHER): Payer: Self-pay | Admitting: Family Medicine

## 2023-12-29 ENCOUNTER — Ambulatory Visit (INDEPENDENT_AMBULATORY_CARE_PROVIDER_SITE_OTHER): Admitting: Family Medicine

## 2023-12-29 VITALS — Temp 98.0°F | Ht 71.0 in | Wt 247.0 lb

## 2023-12-29 DIAGNOSIS — Z6834 Body mass index (BMI) 34.0-34.9, adult: Secondary | ICD-10-CM

## 2023-12-29 DIAGNOSIS — E785 Hyperlipidemia, unspecified: Secondary | ICD-10-CM

## 2023-12-29 DIAGNOSIS — M179 Osteoarthritis of knee, unspecified: Secondary | ICD-10-CM

## 2023-12-29 DIAGNOSIS — E669 Obesity, unspecified: Secondary | ICD-10-CM | POA: Diagnosis not present

## 2023-12-29 NOTE — Progress Notes (Signed)
 "  SUBJECTIVE:  Chief Complaint: Obesity  Interim History: Patient here for follow up.  Halloween went by easily.  Thanksgiving was more difficult to stay consistently on plan.  He is busier on the weekends and is not always having the time to eat on plan and may eat on the fly.  May eat take out or may eat what is available.  He has gotten into habit of eating [employer] deli meat that he is buying in bulk.  Saturdays get busy and he is occasionally skipping lunch which is hard to make up in other places.   Frank Clarke is here to discuss his progress with his obesity treatment plan. He is on the Category 4 Plan and states he is following his eating plan approximately 75 % of the time. He states he is exercising 45-60 minutes 3 times per week.   OBJECTIVE: Visit Diagnoses: Problem List Items Addressed This Visit   None   Vitals Temp: 98 F (36.7 C)   Anthropometric Measurements Height: 5' 11 (1.803 m) Weight: 247 lb (112 kg) BMI (Calculated): 34.46 Weight at Last Visit: 249 lb Weight Lost Since Last Visit: 2 Weight Gained Since Last Visit: 0 Starting Weight: 255 lb Total Weight Loss (lbs): 8 lb (3.629 kg)   Body Composition  Body Fat %: 28.5 % Fat Mass (lbs): 70.6 lbs Muscle Mass (lbs): 168.2 lbs Total Body Water  (lbs): 119 lbs Visceral Fat Rating : 14   Other Clinical Data Today's Visit #: 4 Starting Date: 10/20/23 Comments: Cat 4     ASSESSMENT AND PLAN: Assessment & Plan Osteoarthritis of knee, unspecified laterality, unspecified osteoarthritis type Patient is working on weight loss for pain management of his knee.  He is working on making lifestyle changes that include dietary modifications to help manage his weight.  Will follow up at next appointment as to patients pain, physical activity tolerance and treatment plan. Hyperlipidemia, unspecified hyperlipidemia type Slightly elevated LDL at labs from first appointment.  Patient working on dietary modifications  to limit saturated fat to less than 20% of intake. Generalized obesity- START BMI 35.6 DATE 10/20/23  BMI 34.0-34.9,adult Current BMI 34.7    Diet: Frank Clarke is currently in the action stage of change. As such, his goal is to continue with weight loss efforts and has agreed to the Category 4 Plan and keeping a food journal and adhering to recommended goals of 450-600 calories and 40 or more grams protein for lunch and 550-700 calories and 50 or more grams of protein for dinner.   Exercise:  For substantial health benefits, adults should do at least 150 minutes (2 hours and 30 minutes) a week of moderate-intensity, or 75 minutes (1 hour and 15 minutes) a week of vigorous-intensity aerobic physical activity, or an equivalent combination of moderate- and vigorous-intensity aerobic activity. Aerobic activity should be performed in episodes of at least 10 minutes, and preferably, it should be spread throughout the week.  Behavior Modification:  We discussed the following Behavioral Modification Strategies today: increasing lean protein intake, decreasing simple carbohydrates, increasing vegetables, meal planning and cooking strategies, travel eating strategies, holiday eating strategies, and planning for success.   Return in about 4 weeks (around 01/26/2024).   He was informed of the importance of frequent follow up visits to maximize his success with intensive lifestyle modifications for his multiple health conditions.  Attestation Statements:   Reviewed by clinician on day of visit: allergies, medications, problem list, medical history, surgical history, family history, social history, and  previous encounter notes.   Adelita Cho, MD "

## 2024-01-17 ENCOUNTER — Encounter (INDEPENDENT_AMBULATORY_CARE_PROVIDER_SITE_OTHER): Payer: Self-pay | Admitting: Physician Assistant

## 2024-01-17 ENCOUNTER — Ambulatory Visit (INDEPENDENT_AMBULATORY_CARE_PROVIDER_SITE_OTHER): Admitting: Physician Assistant

## 2024-01-17 VITALS — BP 111/71 | HR 74 | Temp 97.7°F | Ht 71.0 in | Wt 250.0 lb

## 2024-01-17 DIAGNOSIS — E538 Deficiency of other specified B group vitamins: Secondary | ICD-10-CM | POA: Diagnosis not present

## 2024-01-17 DIAGNOSIS — E559 Vitamin D deficiency, unspecified: Secondary | ICD-10-CM

## 2024-01-17 DIAGNOSIS — E88819 Insulin resistance, unspecified: Secondary | ICD-10-CM | POA: Diagnosis not present

## 2024-01-17 DIAGNOSIS — I10 Essential (primary) hypertension: Secondary | ICD-10-CM | POA: Diagnosis not present

## 2024-01-17 DIAGNOSIS — E669 Obesity, unspecified: Secondary | ICD-10-CM | POA: Diagnosis not present

## 2024-01-17 DIAGNOSIS — Z6835 Body mass index (BMI) 35.0-35.9, adult: Secondary | ICD-10-CM

## 2024-01-17 DIAGNOSIS — E785 Hyperlipidemia, unspecified: Secondary | ICD-10-CM

## 2024-01-17 NOTE — Progress Notes (Signed)
 "  SUBJECTIVE: Discussed the use of AI scribe software for clinical note transcription with the patient, who gave verbal consent to proceed.  Chief Complaint: Obesity  Interim History: He is up 3 lbs since his last visit.  Bio impedence scale reviewed: Muscle mass + 2.0 lbs Adipose mass + 1.2 lbs  Frank Clarke is here to discuss his progress with his obesity treatment plan. He is on the Category 4 Plan and keeping a food journal and adhering to recommended goals of 450-600 calories and 40 grams  protein for lunch and 550-700 Calories and 50+ grams of protein for dinner and states he is following his eating plan approximately 50 % of the time. He states he is exercising cardio 45 minutes 3 times per week.  Frank Clarke is a 43 year old male who presents for follow-up on his obesity treatment plan.  He is adhering to his category 4 obesity treatment plan approximately 50% of the time. His diet includes more whole foods, but he is not consistently consuming adequate protein. He maintains proper hydration but occasionally skips meals. He is not achieving the recommended 7 hours of sleep per night and engages in cardiovascular exercise for 45 minutes, three days per week. He has gained three pounds since his last visit, attributing this to the Christmas holiday season.  He has had an increase in muscle mass and a slight increase in adipose tissue. He is taking B12 and vitamin D  supplements.  He has a history of knee replacement surgery in May and is currently taking Celebrex. He has discontinued hydrochlorothiazide , and his blood pressure is well-controlled. He experiences some nerve regrowth sensations post-surgery, which do not significantly affect his sleep. He is taking gabapentin  to manage these sensations.  He is focusing on improving his sleep routine, aiming for a consistent bedtime of 10:30 PM. No significant pain is interfering with sleep post-knee replacement.  He has a 39 year old  daughter and a 56-month-old puppy. He is considering hiring a dog walker to manage his schedule better so he can have time to work out at lunch.  Fasting follow up labs obtained today.  The patient was informed we would discuss the lab results at the next visit unless there is a critical issue that needs to be addressed sooner. The patient agreed to keep the next visit at the agreed upon time to discuss these results.   OBJECTIVE: Visit Diagnoses: Problem List Items Addressed This Visit     Hypertension   Vitamin D  deficiency   Relevant Orders   VITAMIN D  25 Hydroxy (Vit-D Deficiency, Fractures)   Other Visit Diagnoses       Insulin  resistance    -  Primary   Relevant Orders   CMP14+EGFR   Insulin , random     Hyperlipidemia, unspecified hyperlipidemia type       Relevant Orders   Lipid Panel With LDL/HDL Ratio     Low serum vitamin B12       Relevant Orders   Vitamin B12     Generalized obesity- START BMI 35.6 DATE 10/20/23         BMI 35.0-35.9,adult Current BMI 35.0         Generalized obesity Following a category 4 plan with adherence approximately 50% of the time. Consuming more whole foods but not adequate protein. Drinking enough water , skipping meals, and not sleeping at least 7 hours per night. Engaging in cardio for 45 minutes, 3 days per week. Weight increased by 3 pounds since  last visit, attributed to holiday indulgence, but has increased muscle mass.  Positive shift in body composition with increased muscle mass and decreased adipose tissue.  Improvement in body composition will aid in weight loss maintenance and overall well-being. - Continue category 4 plan with focus on measuring food intake. - Increase protein intake, consider using [employer] protein shots or Greek yogurt. - Continue cardio exercises and incorporate light lifting. - Focus on improving sleep hygiene, consider Calm magnesium gummies for sleep.  Primary hypertension Blood pressure well-controlled,  recently discontinued hydrochlorothiazide . Modest weight loss and improved body composition contributing to blood pressure control. BP Readings from Last 3 Encounters:  01/17/24 111/71  11/30/23 109/71  11/03/23 110/71   Plan: Monitor BP off medication Continue to work on nutrition plan to promote weight loss and improve BP control.    Hyperlipidemia Plan to reassess lipid levels with fasting labs. Continued weight loss and improved body composition may lead to better lipid levels. Last lipids Lab Results  Component Value Date   CHOL 188 10/20/2023   HDL 36 (L) 10/20/2023   LDLCALC 118 (H) 10/20/2023   TRIG 192 (H) 10/20/2023   The 10-year ASCVD risk score (Arnett DK, et al., 2019) is: 1.6%   Values used to calculate the score:     Age: 37 years     Clinically relevant sex: Male     Is Non-Hispanic African American: No     Diabetic: No     Tobacco smoker: No     Systolic Blood Pressure: 111 mmHg     Is BP treated: No     HDL Cholesterol: 36 mg/dL     Total Cholesterol: 188 mg/dL Plan: Continue to work on nutrition plan -decreasing simple carbohydrates, increasing lean proteins, decreasing saturated fats and cholesterol , avoiding trans fats and exercise as able to promote weight loss, improve lipids and decrease cardiovascular risks. - Ordered fasting lipid panel in follow up .  Insulin  resistance Plan to reassess insulin  levels with fasting labs. Continued weight loss and improved body composition may improve insulin  sensitivity. Lab Results  Component Value Date   HGBA1C 5.3 10/20/2023   Lab Results  Component Value Date   LDLCALC 118 (H) 10/20/2023   CREATININE 1.07 10/20/2023  His HOMA-IR is 2.2 which is slightly elevated. Optimal level < 1.9. This is complex condition associated with genetics, ectopic fat and lifestyle factors. Insulin  resistance may result in weight gain, abnormal cravings (particularly for carbs) and fatigue. This may result in additional weight  gain and lead to pre-diabetes and diabetes if untreated.   Lab Results  Component Value Date   HGBA1C 5.3 10/20/2023   Lab Results  Component Value Date   INSULIN  11.6 10/20/2023   Lab Results  Component Value Date   GLUCOSE 77 10/20/2023   GLUCOSE 98 01/10/2016    We reviewed treatment options which include losing 7 to 10% of body weight, increasing physical activity to a 150 minutes a week of moderate intensity.He may also be a candidate for pharmacoprophylaxis with metformin or incretin mimetic.   Plan: Continue working on nutrition plan to decrease simple carbohydrates, increase lean proteins and exercise to promote weight loss, improve glycemic control and prevent progression to Type 2 diabetes.  - Ordered fasting insulin  level along with A1c and CMET.  Vitamin D  Deficiency Vitamin D  is not at goal of 50, but very near goal.  Most recent vitamin D  level was 46.6. He is on OTC vitamin D3 5000 IU daily. Lab  Results  Component Value Date   VD25OH 46.6 10/20/2023    Plan: Continue OTC vitamin D3 5000 IU daily Low vitamin D  levels can be associated with adiposity and may result in leptin resistance and weight gain. Also associated with fatigue.  Currently on vitamin D  supplementation without any adverse effects such as nausea, vomiting or muscle weakness.  Recheck vitamin D  level today.   Vitamin B12 deficiency Currently taking B12 supplements. Lab Results  Component Value Date   VITAMINB12 434 10/20/2023   Plan: - Continue B12 supplementation. Recheck B 12 level today and adjust supplementation accordingly.   Vitals Temp: 97.7 F (36.5 C) BP: 111/71 Pulse Rate: 74 SpO2: 100 %   Anthropometric Measurements Height: 5' 11 (1.803 m) Weight: 250 lb (113.4 kg) BMI (Calculated): 34.88 Weight at Last Visit: 247lb Weight Lost Since Last Visit: 0lb Weight Gained Since Last Visit: 3lb Starting Weight: 255lb Total Weight Loss (lbs): 5 lb (2.268 kg)   Body  Composition  Body Fat %: 28.6 % Fat Mass (lbs): 71.8 lbs Muscle Mass (lbs): 170.2 lbs Total Body Water  (lbs): 124.2 lbs Visceral Fat Rating : 14   Other Clinical Data Fasting: Yes Labs: Yes Today's Visit #: 5 Starting Date: 10/20/23     ASSESSMENT AND PLAN:  Diet: Koden is currently in the action stage of change. As such, his goal is to continue with weight loss efforts. He has agreed to Category 4 Plan and keeping a food journal and adhering to recommended goals of 450-600 calories and 40 grams  protein for lunch and 550-700 and 50 + grams of protein for dinner.  Exercise: Alcus has been instructed to work up to a goal of 150 minutes of combined cardio and strengthening exercise per week and add strength training for weight loss and overall health benefits.   Behavior Modification:  We discussed the following Behavioral Modification Strategies today: increasing lean protein intake, decreasing simple carbohydrates, increasing vegetables, increase H2O intake, increase high fiber foods, no skipping meals, meal planning and cooking strategies, avoiding temptations, planning for success, and keep a strict food journal. We discussed various medication options to help Trevel with his weight loss efforts and we both agreed to continue to work on nutritional and behavioral strategies to promote weight loss.  .  Return in about 4 weeks (around 02/14/2024).SABRA He was informed of the importance of frequent follow up visits to maximize his success with intensive lifestyle modifications for his multiple health conditions.  Attestation Statements:   Reviewed by clinician on day of visit: allergies, medications, problem list, medical history, surgical history, family history, social history, and previous encounter notes.   Time spent on visit including pre-visit chart review and post-visit care and charting was 36 minutes.    Demetria Iwai, PA-C  "

## 2024-01-18 ENCOUNTER — Ambulatory Visit (INDEPENDENT_AMBULATORY_CARE_PROVIDER_SITE_OTHER): Payer: Self-pay | Admitting: Physician Assistant

## 2024-01-18 LAB — CMP14+EGFR
ALT: 36 IU/L (ref 0–44)
AST: 28 IU/L (ref 0–40)
Albumin: 4.6 g/dL (ref 4.1–5.1)
Alkaline Phosphatase: 73 IU/L (ref 47–123)
BUN/Creatinine Ratio: 16 (ref 9–20)
BUN: 17 mg/dL (ref 6–24)
Bilirubin Total: 0.6 mg/dL (ref 0.0–1.2)
CO2: 25 mmol/L (ref 20–29)
Calcium: 9.8 mg/dL (ref 8.7–10.2)
Chloride: 105 mmol/L (ref 96–106)
Creatinine, Ser: 1.06 mg/dL (ref 0.76–1.27)
Globulin, Total: 2.3 g/dL (ref 1.5–4.5)
Glucose: 78 mg/dL (ref 70–99)
Potassium: 4.5 mmol/L (ref 3.5–5.2)
Sodium: 143 mmol/L (ref 134–144)
Total Protein: 6.9 g/dL (ref 6.0–8.5)
eGFR: 90 mL/min/1.73

## 2024-01-18 LAB — LIPID PANEL WITH LDL/HDL RATIO
Cholesterol, Total: 174 mg/dL (ref 100–199)
HDL: 42 mg/dL
LDL Chol Calc (NIH): 111 mg/dL — ABNORMAL HIGH (ref 0–99)
LDL/HDL Ratio: 2.6 ratio (ref 0.0–3.6)
Triglycerides: 116 mg/dL (ref 0–149)
VLDL Cholesterol Cal: 21 mg/dL (ref 5–40)

## 2024-01-18 LAB — VITAMIN B12: Vitamin B-12: 922 pg/mL (ref 232–1245)

## 2024-01-18 LAB — VITAMIN D 25 HYDROXY (VIT D DEFICIENCY, FRACTURES): Vit D, 25-Hydroxy: 56.4 ng/mL (ref 30.0–100.0)

## 2024-01-18 LAB — INSULIN, RANDOM: INSULIN: 9.2 u[IU]/mL (ref 2.6–24.9)

## 2024-02-17 ENCOUNTER — Ambulatory Visit (INDEPENDENT_AMBULATORY_CARE_PROVIDER_SITE_OTHER): Admitting: Family Medicine

## 2024-02-17 ENCOUNTER — Encounter (INDEPENDENT_AMBULATORY_CARE_PROVIDER_SITE_OTHER): Payer: Self-pay | Admitting: Family Medicine

## 2024-02-17 VITALS — BP 102/66 | HR 72 | Temp 97.5°F | Ht 71.0 in | Wt 249.0 lb

## 2024-02-17 DIAGNOSIS — R7989 Other specified abnormal findings of blood chemistry: Secondary | ICD-10-CM

## 2024-02-17 DIAGNOSIS — E785 Hyperlipidemia, unspecified: Secondary | ICD-10-CM

## 2024-02-17 DIAGNOSIS — E669 Obesity, unspecified: Secondary | ICD-10-CM

## 2024-02-17 DIAGNOSIS — Z6834 Body mass index (BMI) 34.0-34.9, adult: Secondary | ICD-10-CM
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
## 2024-03-09 ENCOUNTER — Ambulatory Visit (INDEPENDENT_AMBULATORY_CARE_PROVIDER_SITE_OTHER): Admitting: Family Medicine

## 2024-03-30 ENCOUNTER — Ambulatory Visit (INDEPENDENT_AMBULATORY_CARE_PROVIDER_SITE_OTHER): Admitting: Family Medicine

## 2024-05-10 ENCOUNTER — Ambulatory Visit: Admitting: Orthopaedic Surgery
